# Patient Record
Sex: Female | Born: 1960 | Race: White | Hispanic: No | Smoking: Current every day smoker
Health system: Southern US, Community
[De-identification: ages and names within clinical notes are randomized; demographics above are authoritative.]

## PROBLEM LIST (undated history)

## (undated) VITALS — BP 108/59 | HR 86 | Temp 97.9°F | Resp 16 | Ht 61.5 in | Wt 111.0 lb

## (undated) DIAGNOSIS — F32A Depression, unspecified: Secondary | ICD-10-CM

## (undated) DIAGNOSIS — F329 Major depressive disorder, single episode, unspecified: Secondary | ICD-10-CM

## (undated) DIAGNOSIS — M1990 Unspecified osteoarthritis, unspecified site: Secondary | ICD-10-CM

## (undated) DIAGNOSIS — R569 Unspecified convulsions: Secondary | ICD-10-CM

## (undated) HISTORY — PX: OTHER SURGICAL HISTORY: SHX169

## (undated) HISTORY — DX: Unspecified osteoarthritis, unspecified site: M19.90

---

## 1999-09-04 ENCOUNTER — Other Ambulatory Visit: Admission: RE | Admit: 1999-09-04 | Discharge: 1999-09-04 | Payer: Self-pay | Admitting: Obstetrics and Gynecology

## 1999-10-14 ENCOUNTER — Ambulatory Visit (HOSPITAL_COMMUNITY): Admission: RE | Admit: 1999-10-14 | Discharge: 1999-10-14 | Payer: Self-pay | Admitting: Obstetrics and Gynecology

## 1999-10-14 ENCOUNTER — Encounter: Payer: Self-pay | Admitting: Obstetrics and Gynecology

## 2000-03-09 ENCOUNTER — Inpatient Hospital Stay (HOSPITAL_COMMUNITY): Admission: AD | Admit: 2000-03-09 | Discharge: 2000-03-11 | Payer: Self-pay | Admitting: Obstetrics and Gynecology

## 2000-03-09 ENCOUNTER — Encounter (INDEPENDENT_AMBULATORY_CARE_PROVIDER_SITE_OTHER): Payer: Self-pay

## 2000-03-11 ENCOUNTER — Encounter: Payer: Self-pay | Admitting: Obstetrics and Gynecology

## 2000-09-21 ENCOUNTER — Emergency Department (HOSPITAL_COMMUNITY): Admission: EM | Admit: 2000-09-21 | Discharge: 2000-09-21 | Payer: Self-pay

## 2001-11-30 ENCOUNTER — Emergency Department (HOSPITAL_COMMUNITY): Admission: EM | Admit: 2001-11-30 | Discharge: 2001-11-30 | Payer: Self-pay | Admitting: Emergency Medicine

## 2004-02-15 ENCOUNTER — Emergency Department (HOSPITAL_COMMUNITY): Admission: EM | Admit: 2004-02-15 | Discharge: 2004-02-15 | Payer: Self-pay | Admitting: Family Medicine

## 2004-02-23 ENCOUNTER — Emergency Department (HOSPITAL_COMMUNITY): Admission: EM | Admit: 2004-02-23 | Discharge: 2004-02-23 | Payer: Self-pay | Admitting: Family Medicine

## 2004-05-16 ENCOUNTER — Emergency Department (HOSPITAL_COMMUNITY): Admission: EM | Admit: 2004-05-16 | Discharge: 2004-05-16 | Payer: Self-pay | Admitting: Family Medicine

## 2004-11-01 ENCOUNTER — Emergency Department (HOSPITAL_COMMUNITY): Admission: EM | Admit: 2004-11-01 | Discharge: 2004-11-01 | Payer: Self-pay | Admitting: Emergency Medicine

## 2004-11-08 ENCOUNTER — Emergency Department (HOSPITAL_COMMUNITY): Admission: EM | Admit: 2004-11-08 | Discharge: 2004-11-08 | Payer: Self-pay | Admitting: Family Medicine

## 2005-01-11 ENCOUNTER — Emergency Department (HOSPITAL_COMMUNITY): Admission: EM | Admit: 2005-01-11 | Discharge: 2005-01-12 | Payer: Self-pay | Admitting: Emergency Medicine

## 2005-01-12 ENCOUNTER — Inpatient Hospital Stay (HOSPITAL_COMMUNITY): Admission: EM | Admit: 2005-01-12 | Discharge: 2005-01-15 | Payer: Self-pay | Admitting: Psychiatry

## 2005-01-12 ENCOUNTER — Ambulatory Visit: Payer: Self-pay | Admitting: Psychiatry

## 2005-03-03 ENCOUNTER — Emergency Department (HOSPITAL_COMMUNITY): Admission: EM | Admit: 2005-03-03 | Discharge: 2005-03-03 | Payer: Self-pay | Admitting: Family Medicine

## 2005-03-07 ENCOUNTER — Inpatient Hospital Stay (HOSPITAL_COMMUNITY): Admission: EM | Admit: 2005-03-07 | Discharge: 2005-03-09 | Payer: Self-pay | Admitting: Emergency Medicine

## 2005-03-09 ENCOUNTER — Inpatient Hospital Stay (HOSPITAL_COMMUNITY): Admission: AD | Admit: 2005-03-09 | Discharge: 2005-03-11 | Payer: Self-pay | Admitting: Psychiatry

## 2005-03-09 ENCOUNTER — Ambulatory Visit: Payer: Self-pay | Admitting: Psychiatry

## 2005-03-16 ENCOUNTER — Other Ambulatory Visit (HOSPITAL_COMMUNITY): Admission: RE | Admit: 2005-03-16 | Discharge: 2005-06-14 | Payer: Self-pay | Admitting: Psychiatry

## 2005-10-10 ENCOUNTER — Emergency Department (HOSPITAL_COMMUNITY): Admission: EM | Admit: 2005-10-10 | Discharge: 2005-10-11 | Payer: Self-pay | Admitting: Emergency Medicine

## 2006-11-02 ENCOUNTER — Emergency Department (HOSPITAL_COMMUNITY): Admission: EM | Admit: 2006-11-02 | Discharge: 2006-11-02 | Payer: Self-pay | Admitting: Emergency Medicine

## 2007-01-16 ENCOUNTER — Emergency Department (HOSPITAL_COMMUNITY): Admission: EM | Admit: 2007-01-16 | Discharge: 2007-01-16 | Payer: Self-pay | Admitting: Emergency Medicine

## 2007-12-30 ENCOUNTER — Emergency Department (HOSPITAL_COMMUNITY): Admission: EM | Admit: 2007-12-30 | Discharge: 2007-12-30 | Payer: Self-pay | Admitting: Emergency Medicine

## 2008-09-16 ENCOUNTER — Emergency Department (HOSPITAL_COMMUNITY): Admission: EM | Admit: 2008-09-16 | Discharge: 2008-09-16 | Payer: Self-pay | Admitting: Family Medicine

## 2008-12-01 ENCOUNTER — Emergency Department (HOSPITAL_COMMUNITY): Admission: EM | Admit: 2008-12-01 | Discharge: 2008-12-02 | Payer: Self-pay | Admitting: Emergency Medicine

## 2010-05-15 LAB — CBC
HCT: 36.2 % (ref 36.0–46.0)
Hemoglobin: 12.6 g/dL (ref 12.0–15.0)
MCHC: 34.8 g/dL (ref 30.0–36.0)
MCV: 92.7 fL (ref 78.0–100.0)
Platelets: 270 10*3/uL (ref 150–400)
RDW: 13.3 % (ref 11.5–15.5)

## 2010-05-15 LAB — ETHANOL: Alcohol, Ethyl (B): 203 mg/dL — ABNORMAL HIGH (ref 0–10)

## 2010-05-15 LAB — APTT: aPTT: 26 seconds (ref 24–37)

## 2010-05-15 LAB — LACTIC ACID, PLASMA: Lactic Acid, Venous: 2.2 mmol/L (ref 0.5–2.2)

## 2010-05-15 LAB — COMPREHENSIVE METABOLIC PANEL
ALT: 13 U/L (ref 0–35)
Calcium: 8.3 mg/dL — ABNORMAL LOW (ref 8.4–10.5)
GFR calc Af Amer: >60 mL/min (ref >60)
Glucose, Bld: 97 mg/dL (ref 70–99)
Total Bilirubin: 0.4 mg/dL (ref 0.3–1.2)
Total Protein: 6.8 g/dL (ref 6.0–8.3)

## 2010-05-15 LAB — PROTIME-INR: INR: 0.99 (ref 0.00–1.49)

## 2010-05-15 LAB — URINALYSIS, ROUTINE W REFLEX MICROSCOPIC
Bilirubin Urine: NEGATIVE
Glucose, UA: NEGATIVE mg/dL
Ketones, ur: NEGATIVE mg/dL
Protein, ur: NEGATIVE mg/dL
Specific Gravity, Urine: 1.042 — ABNORMAL HIGH (ref 1.005–1.030)
pH: 5 (ref 5.0–8.0)

## 2010-05-15 LAB — POCT I-STAT, CHEM 8
Calcium, Ion: 1.08 mmol/L — ABNORMAL LOW (ref 1.12–1.32)
HCT: 40 % (ref 36.0–46.0)
Hemoglobin: 13.6 g/dL (ref 12.0–15.0)
TCO2: 20 mmol/L (ref 0–100)

## 2010-05-15 LAB — POCT PREGNANCY, URINE: Preg Test, Ur: NEGATIVE

## 2010-05-15 LAB — URINE MICROSCOPIC-ADD ON

## 2010-05-15 LAB — RAPID URINE DRUG SCREEN, HOSP PERFORMED: Barbiturates: NOT DETECTED

## 2010-05-15 LAB — TYPE AND SCREEN

## 2010-05-15 LAB — ABO/RH: ABO/RH(D): B POS

## 2010-05-21 ENCOUNTER — Ambulatory Visit (HOSPITAL_COMMUNITY): Payer: Self-pay | Admitting: Psychiatry

## 2010-05-21 DIAGNOSIS — F1910 Other psychoactive substance abuse, uncomplicated: Secondary | ICD-10-CM

## 2010-05-21 DIAGNOSIS — F3189 Other bipolar disorder: Secondary | ICD-10-CM
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
## 2010-05-28 ENCOUNTER — Encounter (HOSPITAL_COMMUNITY): Payer: Self-pay | Admitting: Psychiatry

## 2010-05-28 DIAGNOSIS — F3189 Other bipolar disorder: Secondary | ICD-10-CM

## 2010-06-09 ENCOUNTER — Ambulatory Visit (HOSPITAL_COMMUNITY): Payer: Self-pay | Admitting: Marriage and Family Therapist

## 2010-06-20 ENCOUNTER — Encounter (HOSPITAL_COMMUNITY): Payer: Self-pay | Admitting: Psychiatry

## 2010-06-20 DIAGNOSIS — F3189 Other bipolar disorder: Secondary | ICD-10-CM
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
## 2010-06-27 NOTE — Discharge Summary (Signed)
[facility]  Patient:    [name]                       MRN: [id number] Adm. Date:  [id number] Disc. Date: [id number] Attending:  Oliver Pila                           Discharge Summary  ADMISSION DIAGNOSES:          1. Intrauterine pregnancy at 39 weeks.                               2. Advanced maternal age.                               3. Prior cesarean section.                               4. Group B streptococcus carrier.                               5. Desires sterility.  DISCHARGE DIAGNOSES:          1. Intrauterine pregnancy at 39 weeks.                               2. Advanced maternal age.                               3. Prior cesarean section.                               4. Group B streptococcus carrier.                               5. Desires sterility.                               6. shoulder dystocia.  PROCEDURES:                   Vaginal birth after cesarean section.                               Postpartum tubal ligation.  COMPLICATIONS:                None.  CONSULTATIONS:                None.  HISTORY AND PHYSICAL:         This is a 50 year old white female, gravida 4, para 3-0-0-2, with an EGA of 39+ weeks with and EDC of January 30 by a first trimester ultrasound who presents for induction due to a favorable cervix at term with ongoing back pain. Pregnancy complicated by advanced maternal age for which she declined an amniocentesis but had a normal ultrasound. Previous cesarean section with successful VBAC after that. She is also group B strep positive and desires tubal  sterilization.  PRENATAL LABORATORY DATA:     Blood type is B positive with a negative antibody screen. RPR nonreactive. Rubella immune. Hepatitis B surface antigen negative. HIV negative. Gonorrhea and Chlamydia negative. Group B strep is positive.  PAST OBSTETRIC HISTORY:       1981 vaginal delivery of 5 pound 5 ounces.  1990 cesarean section for breech. 1993 VBAC 8 pounds 3 ounces.  PAST MEDICAL HISTORY:         Depression.  SURGICAL HISTORY:             Cesarean section.  PHYSICAL EXAMINATION:  GENERAL:                      Afebrile with stable vital signs. Fetal heart tracing was reactive.  ABDOMEN:                      Gravid, nontender with an estimate fetal weight of 7-1/2 to 8 pounds.  PELVIC:                       Cervix was 2, 50, -2, and posterior.  HOSPITAL COURSE:              The patient was admitted and started on Pitocin for induction. She was also started on penicillin for group B strep prophylaxis. On Dr. Loleta Books first exam she attempted to perform artificial rupture of membranes and got a little bit of fluid. On the Pitocin, the patient progressed to 4, 75, and -2 and had evidence of ruptured membranes. She then received an epidural. She progressed throughout the day, progressed to complete fairly quickly and pushed well. She had a VBAC of a vigorous female infant with Apgars of 8 and 9 that weighted 7 pounds 8 ounces. There was a nuchal cord x 1 which was reduced. There was a moderate shoulder dystocia which was resolved with McRoberts maneuver, suprapubic pressure, Woodscrew maneuver, and posterior arm release.  The placenta delivered manually and her uterus was inspected without remaining fragments noted. Her old uterine scar was intact. Cervix, rectum, and perineum were intact. She had a small vaginal cyst on the posterior wall which was drained with a needle of milky fluid. Estimated blood loss was 400 cc. The patient desired postpartum tubal sterilization, and on postpartum day #1, she underwent a tubal ligation by Dr. Senaida Ores under epidural anesthesia without complications.  Postoperatively, she did very well, remained afebrile, and was rapidly able to ambulated and tolerate a regular diet. However, on the morning of postpartum day #2, she complained of sever right  lower quadrant pain since having the tubal ligation. The Percocet helped but she still had significant pain.  LABORATORIES:                 Admission hemoglobin of 10.7. Postpartum hemoglobin 9.9 and a repeat hemoglobin on postpartum day #2 of 10.5. An abdominal ultrasound performed due to the pain, was essentially normal. Her pain improved throughout the day and on the evening of January 31 she was felt to be stable enough for discharge home.  CONDITION ON DISCHARGE:       Stable.  DISPOSITION:                  Discharge to home.  DISCHARGE INSTRUCTIONS:       Diet: Regular. Activity: pelvic rest.  FOLLOWUP:  In six weeks.  DISCHARGE MEDICATIONS:        Percocet p.r.n. pain and she is given our discharge pamphlet. DD:  03/11/00 TD:  03/11/00 Job: [id number]

## 2010-06-27 NOTE — Discharge Summary (Signed)
NAME:  [name], [name] NO.:  [id number]   MEDICAL RECORD NO.:  [id number]          PATIENT TYPE:  IPS   LOCATION:  0301                          FACILITY:  [facility]   PHYSICIAN:  Jeanice Lim, M.D. DATE OF BIRTH:  [birth year]   DATE OF ADMISSION:  01/12/2005  DATE OF DISCHARGE:  01/15/2005                                 DISCHARGE SUMMARY   IDENTIFYING DATA:  This is a 50 year old Caucasian female, single,  voluntarily admitted, with history of anxiety, tremor, trying to quit  alcohol, intoxicated, with an alcohol level of 137 in the ER, agitated,  pulling hair, cursing.  Given Geodon IM for threatening behavior, reporting  suicidal thoughts with plan to overdose on pills alone while in a hotel  room, drinking alcohol, escalating over the last 6 weeks, a bottle of wine  daily for 2 weeks, endorsed history of mood problems since teenager.  First  [facility] admission with a history of multiple  hospitalizations x9 years, had been at [facility], [facility],  and  [facility] for depression in the past.  In the past, had been on Lamictal,  Depakote, Paxil.   ADMISSION MEDICATIONS:  Cymbalta, Ativan, Xanax off the street, and  Seroquel.   ALLERGIES:  No known drug allergies.   PHYSICAL AND NEUROLOGICAL EXAMINATION:  Within normal limits.   ROUTINE ADMISSION LABS:  Within normal limits.   MENTAL STATUS EXAM:  Fully alert, cooperative, pleasant, dazed look,  decreased speech.  Mood depressed, affect restricted, feeling guilty about  alcohol use.  Thought processes goal directed, no agitation or paranoia,  positive fleeting suicidal thoughts, cognitively intact, judgment and  insight poor.   ADMISSION DIAGNOSES:  AXIS I:  Bipolar disorder, not otherwise specified;  alcohol dependence; benzodiazepines dependence; possible substance-induced  mood disorder superimposed on bipolar disorder.  AXIS II:  Deferred.  AXIS III:  None.  AXIS IV:  Severe,  protracted grief secondary to death of daughter, problems  with limited support system.  AXIS V:  30/65.   HOSPITAL COURSE:  The patient was admitted and ordered routine p.r.n.  medications, underwent further monitoring, and was encouraged to participate  in individual, group and milieu therapy.  Labs were monitored and mild  hypokalemia treated.  The patient was monitored for safety, participated in  therapy, developed a relapse prevention plan as she was stabilized on  medications, reported resolution of withdrawal symptoms and stabilization of  mood, and was discharged in improved condition.  Family meeting with husband  went well and he was determined to be supportive and understanding and  agreeable with the aftercare plan.  The patient was discharged in improved  condition.  Mood was euthymic, affect brighter, thought processes goal  directed, no dangerous ideation, no withdrawal symptoms, reporting  motivation to be compliant with remaining abstinent and with aftercare  appointments.  The patient was given medication education again and  discharged on:  1.  Cymbalta 30 mg q.a.m. and 30 mg at 6 p.m.  2.  Seroquel 300 mg at 8 p.m.  3.  Elavil 10 mg 3 at  8 p.m.   DISPOSITION:  The patient was to follow up with Dr. Bayard Males on Tuesday,  December 12, at 2 p.m. and Bartholomew Crews on Thursday, December 14, at 5 p.m.   DISCHARGE DIAGNOSES:  AXIS I:  Bipolar disorder, not otherwise specified;  alcohol dependence; benzodiazepines dependence; possible substance-induced  mood disorder superimposed on bipolar disorder.  AXIS II:  Deferred.  AXIS III:  None.  AXIS IV:  Severe, protracted grief secondary to death of daughter, problems  with limited support system.  AXIS V:  Global assessment of functioning on discharge was 55.      Jeanice Lim, M.D.  Electronically Signed     JEM/MEDQ  D:  02/03/2005  T:  02/03/2005  Job:  [id number]

## 2010-06-27 NOTE — Op Note (Signed)
[facility]  Patient:    [name]                       MRN: [id number] Proc. Date: 03/10/00 Adm. Date:  [id number] Attending:  Oliver Pila                           Operative Report  PREOPERATIVE DIAGNOSES: 1. Status post normal spontaneous vaginal delivery. 2. Desires sterility.  POSTOPERATIVE DIAGNOSES: 1. Status post normal spontaneous vaginal delivery. 2. Desires sterility.  PROCEDURE:  Tubal ligation.  SURGEON:  Dr. Huel Cote.  ANESTHESIA:  Epidural.  FINDINGS:  Normal uterus and tubes and ovaries were noted bilaterally.  DESCRIPTION OF PROCEDURE:  The patient was taken to the operating room where epidural anesthesia was found to be adequate by ______ test. She was then prepped and draped in normal sterile fashion in the dorsal supine position. A small infraumbilical incision was made with the scalpel approximately 2 cm and carried through to the underlying layer of fascia by sharp dissection. The fascia was opened sharply and the peritoneal cavity inspected and found to be within normal limits. An [employer] retractor was then placed within the incision and the patients right fallopian tube was identified, grasped with Babcock clamps and elevated through the incision. This was then raised in a 2-3 cm buckle of tube which was tied off with #0 Vicryl in two free ties. The tied off segment was then amputated and handed off to pathology and the free ends cauterized with Bovie cautery. All were found to be hemostatic, therefore, the pedicle was returned to the abdomen. Attention was then turned to the patients left fallopian tube which in a similar fashion was grasped with Babcock clamps, traced out to the fimbriated end and a 2 cm segment removed after it had been tied off with zero plain ties. Each free end was cauterized and found to be hemostatic. This was also then returned to the abdomen. The fascia was then closed  with #0 Vicryl in a running fashion and skin was closed with 3-0 Vicryl in a subcuticular stitch. Sponge, lap, and needle counts were correct x 2 and the patient was taken to the recovery room in stable condition. DD:  03/10/00 TD:  03/10/00 Job: [id number]
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
## 2010-07-15 ENCOUNTER — Ambulatory Visit (HOSPITAL_COMMUNITY): Payer: Self-pay | Admitting: Marriage and Family Therapist

## 2010-07-18 ENCOUNTER — Encounter (HOSPITAL_COMMUNITY): Payer: Self-pay | Admitting: Psychiatry

## 2010-07-28 ENCOUNTER — Encounter (HOSPITAL_COMMUNITY): Payer: Self-pay | Admitting: Psychiatry

## 2010-07-28 DIAGNOSIS — F3189 Other bipolar disorder: Secondary | ICD-10-CM

## 2010-08-11 ENCOUNTER — Encounter (HOSPITAL_BASED_OUTPATIENT_CLINIC_OR_DEPARTMENT_OTHER): Payer: Self-pay | Admitting: Marriage and Family Therapist

## 2010-08-11 DIAGNOSIS — F3160 Bipolar disorder, current episode mixed, unspecified: Secondary | ICD-10-CM

## 2010-08-20 ENCOUNTER — Encounter (HOSPITAL_BASED_OUTPATIENT_CLINIC_OR_DEPARTMENT_OTHER): Payer: Self-pay | Admitting: Marriage and Family Therapist

## 2010-08-20 DIAGNOSIS — F3160 Bipolar disorder, current episode mixed, unspecified: Secondary | ICD-10-CM

## 2010-08-20 DIAGNOSIS — F1020 Alcohol dependence, uncomplicated: Secondary | ICD-10-CM

## 2010-08-28 ENCOUNTER — Encounter (HOSPITAL_BASED_OUTPATIENT_CLINIC_OR_DEPARTMENT_OTHER): Payer: Self-pay | Admitting: Marriage and Family Therapist

## 2010-08-28 DIAGNOSIS — F1020 Alcohol dependence, uncomplicated: Secondary | ICD-10-CM

## 2010-08-28 DIAGNOSIS — F3160 Bipolar disorder, current episode mixed, unspecified: Secondary | ICD-10-CM

## 2010-09-08 ENCOUNTER — Encounter (HOSPITAL_COMMUNITY): Payer: Self-pay | Admitting: Marriage and Family Therapist

## 2010-09-08 ENCOUNTER — Encounter (HOSPITAL_COMMUNITY): Payer: Self-pay | Admitting: Psychiatry

## 2010-09-08 DIAGNOSIS — F3189 Other bipolar disorder: Secondary | ICD-10-CM
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
## 2010-09-18 ENCOUNTER — Encounter (HOSPITAL_BASED_OUTPATIENT_CLINIC_OR_DEPARTMENT_OTHER): Payer: Self-pay | Admitting: Marriage and Family Therapist

## 2010-09-18 DIAGNOSIS — F39 Unspecified mood [affective] disorder: Secondary | ICD-10-CM

## 2010-09-18 DIAGNOSIS — F1020 Alcohol dependence, uncomplicated: Secondary | ICD-10-CM

## 2010-09-25 ENCOUNTER — Encounter (HOSPITAL_BASED_OUTPATIENT_CLINIC_OR_DEPARTMENT_OTHER): Payer: Self-pay | Admitting: Marriage and Family Therapist

## 2010-09-25 DIAGNOSIS — F1020 Alcohol dependence, uncomplicated: Secondary | ICD-10-CM

## 2010-09-25 DIAGNOSIS — F3160 Bipolar disorder, current episode mixed, unspecified: Secondary | ICD-10-CM

## 2010-10-02 ENCOUNTER — Encounter (HOSPITAL_COMMUNITY): Payer: Self-pay | Admitting: Marriage and Family Therapist

## 2010-10-08 ENCOUNTER — Encounter (HOSPITAL_COMMUNITY): Payer: Self-pay | Admitting: Psychiatry

## 2010-10-08 ENCOUNTER — Encounter (INDEPENDENT_AMBULATORY_CARE_PROVIDER_SITE_OTHER): Payer: Self-pay | Admitting: Marriage and Family Therapist

## 2010-10-08 DIAGNOSIS — F1020 Alcohol dependence, uncomplicated: Secondary | ICD-10-CM

## 2010-10-08 DIAGNOSIS — F3160 Bipolar disorder, current episode mixed, unspecified: Secondary | ICD-10-CM

## 2010-10-16 ENCOUNTER — Inpatient Hospital Stay (INDEPENDENT_AMBULATORY_CARE_PROVIDER_SITE_OTHER)
Admission: RE | Admit: 2010-10-16 | Discharge: 2010-10-16 | Disposition: A | Discharge status: Home / Self Care | Payer: Self-pay | Source: Ambulatory Visit | Attending: Family Medicine | Admitting: Family Medicine

## 2010-10-16 ENCOUNTER — Encounter (INDEPENDENT_AMBULATORY_CARE_PROVIDER_SITE_OTHER): Payer: Self-pay | Admitting: Marriage and Family Therapist

## 2010-10-16 ENCOUNTER — Ambulatory Visit (INDEPENDENT_AMBULATORY_CARE_PROVIDER_SITE_OTHER): Payer: Self-pay

## 2010-10-16 DIAGNOSIS — F3160 Bipolar disorder, current episode mixed, unspecified: Secondary | ICD-10-CM

## 2010-10-16 DIAGNOSIS — F1020 Alcohol dependence, uncomplicated: Secondary | ICD-10-CM

## 2010-10-16 DIAGNOSIS — S92309A Fracture of unspecified metatarsal bone(s), unspecified foot, initial encounter for closed fracture: Secondary | ICD-10-CM

## 2010-10-22 ENCOUNTER — Encounter (HOSPITAL_COMMUNITY): Payer: Self-pay | Admitting: Marriage and Family Therapist

## 2010-10-30 ENCOUNTER — Encounter (INDEPENDENT_AMBULATORY_CARE_PROVIDER_SITE_OTHER): Payer: Self-pay | Admitting: Marriage and Family Therapist

## 2010-10-30 DIAGNOSIS — F39 Unspecified mood [affective] disorder: Secondary | ICD-10-CM

## 2010-11-03 ENCOUNTER — Encounter (INDEPENDENT_AMBULATORY_CARE_PROVIDER_SITE_OTHER): Payer: Self-pay | Admitting: Psychiatry

## 2010-11-03 ENCOUNTER — Encounter (HOSPITAL_COMMUNITY): Payer: Self-pay | Admitting: Marriage and Family Therapist

## 2010-11-03 DIAGNOSIS — F3189 Other bipolar disorder: Secondary | ICD-10-CM

## 2010-11-11 LAB — POCT CARDIAC MARKERS: Troponin i, poc: <0.05

## 2010-11-11 LAB — DIFFERENTIAL
Basophils Absolute: 0
Eosinophils Relative: 1
Lymphocytes Relative: 20
Neutro Abs: 8.5 — ABNORMAL HIGH
Neutrophils Relative %: 76

## 2010-11-11 LAB — POCT I-STAT, CHEM 8
BUN: 8
Calcium, Ion: 1.15
TCO2: 25

## 2010-11-11 LAB — CBC
HCT: 38.2
Platelets: 283
RDW: 13.1

## 2010-11-13 ENCOUNTER — Encounter (INDEPENDENT_AMBULATORY_CARE_PROVIDER_SITE_OTHER): Payer: Self-pay | Admitting: Marriage and Family Therapist

## 2010-11-13 DIAGNOSIS — F3160 Bipolar disorder, current episode mixed, unspecified: Secondary | ICD-10-CM

## 2010-11-13 DIAGNOSIS — F1020 Alcohol dependence, uncomplicated: Secondary | ICD-10-CM

## 2010-11-19 ENCOUNTER — Encounter (HOSPITAL_COMMUNITY): Payer: Self-pay | Admitting: Marriage and Family Therapist

## 2010-11-20 LAB — BASIC METABOLIC PANEL
CO2: 25
Chloride: 102
GFR calc Af Amer: >60
Potassium: 3.9
Sodium: 134 — ABNORMAL LOW

## 2010-11-20 LAB — URINALYSIS, ROUTINE W REFLEX MICROSCOPIC
Bilirubin Urine: NEGATIVE
Glucose, UA: NEGATIVE
Ketones, ur: NEGATIVE
Nitrite: NEGATIVE
pH: 6

## 2010-11-20 LAB — DIFFERENTIAL
Basophils Relative: 0
Eosinophils Absolute: 0.2
Monocytes Absolute: 0.4
Monocytes Relative: 4

## 2010-11-20 LAB — CBC
HCT: 41.6
Hemoglobin: 14.4
MCHC: 34.5
MCV: 86.6
RBC: 4.81

## 2010-11-20 LAB — PREGNANCY, URINE: Preg Test, Ur: NEGATIVE

## 2010-11-27 ENCOUNTER — Encounter (INDEPENDENT_AMBULATORY_CARE_PROVIDER_SITE_OTHER): Payer: Self-pay | Admitting: Marriage and Family Therapist

## 2010-11-27 DIAGNOSIS — F39 Unspecified mood [affective] disorder: Secondary | ICD-10-CM

## 2010-11-27 DIAGNOSIS — F1020 Alcohol dependence, uncomplicated: Secondary | ICD-10-CM

## 2010-12-03 ENCOUNTER — Encounter (INDEPENDENT_AMBULATORY_CARE_PROVIDER_SITE_OTHER): Payer: Self-pay | Admitting: Psychiatry

## 2010-12-03 DIAGNOSIS — F3189 Other bipolar disorder: Secondary | ICD-10-CM

## 2010-12-04 ENCOUNTER — Encounter (HOSPITAL_COMMUNITY): Payer: Self-pay | Admitting: Marriage and Family Therapist

## 2010-12-11 ENCOUNTER — Encounter (HOSPITAL_COMMUNITY): Payer: Self-pay | Admitting: Marriage and Family Therapist

## 2011-01-09 ENCOUNTER — Ambulatory Visit (HOSPITAL_COMMUNITY): Payer: Self-pay | Admitting: Psychiatry

## 2011-01-09 ENCOUNTER — Encounter (HOSPITAL_COMMUNITY): Payer: Self-pay | Admitting: Psychiatry

## 2011-01-09 DIAGNOSIS — F319 Bipolar disorder, unspecified: Secondary | ICD-10-CM

## 2011-01-09 MED ORDER — DULOXETINE HCL 30 MG PO CPEP: 30.0000 mg | ORAL_CAPSULE | Freq: Every day | ORAL | Status: DC

## 2011-01-09 MED ORDER — QUETIAPINE FUMARATE 200 MG PO TABS: 200.0000 mg | ORAL_TABLET | Freq: Every day | ORAL | Status: DC

## 2011-01-09 NOTE — Progress Notes (Signed)
Patient came for her followup appointment. She endorsed this month is difficult has her brother still in the hospital and she has been not working to find as she's been taking care of her brother. She is been compliant with her medications seroquel 200 mg and Cymbalta 30 mg. She is still waiting for Seroquel from [facility]. The she has been under stress but she denies any agitation anger or mood swings. She is sleeping better with her medication. She has not started into custody better and she wants to give more time to his brother who has been very sick. She reported no side effects of medication. She has been seeing therapist regularly. She is not using any drugs  Mental status examination Patient is casually dressed. She is fairly groomed. Her speech is fast and rapid but clear and coherent. She denies any auditory or visual hallucination. She described her mood is anxious and her affect is constricted. She denies any active or passive suicidal thinking homicidal thinking. There are no psychotic symptoms present. She's alert and oriented x3. Her insight judgment and pulse control is okay.  Assessment Bipolar disorder Polysubstance dependence in complete remission  Plan I will continue her current medication in his prescription of Cymbalta 30 mg and seroquel200 mg with refills given. I have explained risks and benefits of medication. She will continue to see therapist regularly I will see her again in 2 months

## 2011-01-12 ENCOUNTER — Ambulatory Visit (INDEPENDENT_AMBULATORY_CARE_PROVIDER_SITE_OTHER): Payer: Self-pay | Admitting: Marriage and Family Therapist

## 2011-01-12 DIAGNOSIS — F1020 Alcohol dependence, uncomplicated: Secondary | ICD-10-CM

## 2011-01-12 DIAGNOSIS — F3162 Bipolar disorder, current episode mixed, moderate: Secondary | ICD-10-CM

## 2011-01-12 NOTE — Progress Notes (Signed)
   THERAPIST PROGRESS NOTE  Session Time: 8:00 - 9:00 a.m.  Participation Level: Active  Behavioral Response: Well GroomedAlertAnxious  Type of Therapy: Individual Therapy  Treatment Goals addressed: Coping and Diagnosis: bipolar d/o & alcoholism  Interventions: Strength-based, Assertiveness Training and Family Systems  Summary: [name] is a 50 y.o. female who presents with bipolar disorder and alcoholism in remission.  Patient reports she quit her job because it was emotionally unhealthy for her.  States she feels better since quitting the job.  She also reports she is investigating getting supervision in order to see her 47 y/o daughter that she has not seen in almost a year.  Patient also talked about giving up her apartment to her niece and is now living with a female that she knew in high school.  Patient did state before she moved in with him she asked "people I trust" their opinion about whether or not to move in with the female and states they supported this decision.  Patient states this is the "healthiest relationship I've ever had with a female."  She reports continuing to stay sober and basically is spending what she called "quality time" with the family.   Suicidal/Homicidal: Nowithout intent/plan  Therapist Response: Used strength-based therapy to support patient in her decision to quit her job.  Also talked about patient asserting herself about the BF whether or not others around her did not approve (patient reported she was worried this writer would not approve).  Plan: Return again in 2 weeks.  Diagnosis: Axis I: Bipolar, mixed and Alcoholism, in remission    Axis II: Deferred    Crecencio Kwiatek, LMFT 01/12/2011

## 2011-01-26 ENCOUNTER — Encounter (HOSPITAL_COMMUNITY): Payer: Self-pay | Admitting: Marriage and Family Therapist

## 2011-02-09 ENCOUNTER — Ambulatory Visit (HOSPITAL_COMMUNITY): Payer: Self-pay | Admitting: Marriage and Family Therapist

## 2011-02-13 ENCOUNTER — Other Ambulatory Visit: Payer: Self-pay

## 2011-02-13 ENCOUNTER — Encounter: Payer: Self-pay | Admitting: Emergency Medicine

## 2011-02-13 ENCOUNTER — Emergency Department (HOSPITAL_COMMUNITY)
Admission: EM | Admit: 2011-02-13 | Discharge: 2011-02-13 | Disposition: A | Discharge status: Home / Self Care | Payer: Self-pay | Attending: Emergency Medicine | Admitting: Emergency Medicine

## 2011-02-13 ENCOUNTER — Encounter (HOSPITAL_COMMUNITY): Payer: Self-pay | Admitting: Psychiatry

## 2011-02-13 DIAGNOSIS — R5381 Other malaise: Secondary | ICD-10-CM | POA: Insufficient documentation

## 2011-02-13 DIAGNOSIS — F1310 Sedative, hypnotic or anxiolytic abuse, uncomplicated: Secondary | ICD-10-CM | POA: Insufficient documentation

## 2011-02-13 DIAGNOSIS — F1910 Other psychoactive substance abuse, uncomplicated: Secondary | ICD-10-CM

## 2011-02-13 DIAGNOSIS — R5383 Other fatigue: Secondary | ICD-10-CM | POA: Insufficient documentation

## 2011-02-13 DIAGNOSIS — F3289 Other specified depressive episodes: Secondary | ICD-10-CM | POA: Insufficient documentation

## 2011-02-13 DIAGNOSIS — T424X4A Poisoning by benzodiazepines, undetermined, initial encounter: Secondary | ICD-10-CM | POA: Insufficient documentation

## 2011-02-13 DIAGNOSIS — T43502A Poisoning by unspecified antipsychotics and neuroleptics, intentional self-harm, initial encounter: Secondary | ICD-10-CM | POA: Insufficient documentation

## 2011-02-13 DIAGNOSIS — F329 Major depressive disorder, single episode, unspecified: Secondary | ICD-10-CM | POA: Insufficient documentation

## 2011-02-13 HISTORY — DX: Major depressive disorder, single episode, unspecified: F32.9

## 2011-02-13 HISTORY — DX: Depression, unspecified: F32.A

## 2011-02-13 LAB — RAPID URINE DRUG SCREEN, HOSP PERFORMED
Amphetamines: NOT DETECTED
Benzodiazepines: POSITIVE — AB
Tetrahydrocannabinol: POSITIVE — AB

## 2011-02-13 LAB — DIFFERENTIAL
Basophils Absolute: 0.1 10*3/uL (ref 0.0–0.1)
Eosinophils Absolute: 0.6 10*3/uL (ref 0.0–0.7)
Eosinophils Relative: 5 % (ref 0–5)
Neutrophils Relative %: 63 % (ref 43–77)

## 2011-02-13 LAB — CBC
MCH: 30.5 pg (ref 26.0–34.0)
MCV: 89 fL (ref 78.0–100.0)
Platelets: 358 10*3/uL (ref 150–400)
RBC: 4.1 MIL/uL (ref 3.87–5.11)
RDW: 13.5 % (ref 11.5–15.5)
WBC: 11.2 10*3/uL — ABNORMAL HIGH (ref 4.0–10.5)

## 2011-02-13 LAB — COMPREHENSIVE METABOLIC PANEL
ALT: 8 U/L (ref 0–35)
AST: 14 U/L (ref 0–37)
Albumin: 3.4 g/dL — ABNORMAL LOW (ref 3.5–5.2)
Alkaline Phosphatase: 63 U/L (ref 39–117)
Calcium: 8.3 mg/dL — ABNORMAL LOW (ref 8.4–10.5)
Potassium: 3.4 mEq/L — ABNORMAL LOW (ref 3.5–5.1)
Sodium: 140 mEq/L (ref 135–145)
Total Protein: 7 g/dL (ref 6.0–8.3)

## 2011-02-13 LAB — ETHANOL: Alcohol, Ethyl (B): 146 mg/dL — ABNORMAL HIGH (ref 0–11)

## 2011-02-13 MED ORDER — SODIUM CHLORIDE 0.9 % IV SOLN
Freq: Once | INTRAVENOUS | Status: AC
  Administered 2011-02-13: 03:00:00 via INTRAVENOUS

## 2011-02-13 NOTE — ED Provider Notes (Signed)
History     CSN: [id number]  Arrival date & time 02/13/11  0220   First MD Initiated Contact with Patient 02/13/11 [phone]      Chief Complaint  Patient presents with  . Drug Overdose    xanax    (Consider location/radiation/quality/duration/timing/severity/associated sxs/prior treatment) Patient is a 51 y.o. female presenting with Overdose. The history is provided by a friend and a relative. The history is limited by the condition of the patient.  Drug Overdose   patient here after drinking alcohol and taking Xanax. According to the boyfriend, patient was drinking alcohol tonight and became very emotional and took half of his bottle of Xanax. She denied any suicidal ideations to him but did admit some to EMS possibly. No recent history of increased depression according to the boyfriend. She hasn't taken her medications appropriately. Nothing makes her symptoms better or worse  Past Medical History  Diagnosis Date  . Depression     History reviewed. No pertinent past surgical history.  History reviewed. No pertinent family history.  History  Substance Use Topics  . Smoking status: Not on file  . Smokeless tobacco: Not on file  . Alcohol Use:     OB History    Grav Para Term Preterm Abortions TAB SAB Ect Mult Living                  Review of Systems  Unable to perform ROS   Allergies  Review of patient's allergies indicates no known allergies.  Home Medications   Current Outpatient Rx  Name Route Sig Dispense Refill  . DULOXETINE HCL 20 MG PO CPEP Oral Take 30 mg by mouth daily.     . QUETIAPINE FUMARATE 100 MG PO TABS Oral Take 300 mg by mouth at bedtime.       BP 97/81  Pulse 106  Resp 18  SpO2 100%  Physical Exam  Nursing note and vitals reviewed. Constitutional: She appears well-developed and well-nourished. She appears lethargic.  Non-toxic appearance. No distress.  HENT:  Head: Normocephalic and atraumatic.  Eyes: Conjunctivae, EOM and lids are  normal. Pupils are equal, round, and reactive to light.  Neck: Normal range of motion. Neck supple. No tracheal deviation present. No mass present.  Cardiovascular: Normal rate, regular rhythm and normal heart sounds.  Exam reveals no gallop.   No murmur heard. Pulmonary/Chest: Effort normal and breath sounds normal. No stridor. No respiratory distress. She has no decreased breath sounds. She has no wheezes. She has no rhonchi. She has no rales.  Abdominal: Soft. Normal appearance and bowel sounds are normal. She exhibits no distension. There is no tenderness. There is no rebound and no CVA tenderness.  Musculoskeletal: Normal range of motion. She exhibits no edema and no tenderness.  Neurological: She has normal strength. She appears lethargic. No sensory deficit. GCS eye subscore is 4. GCS verbal subscore is 3. GCS motor subscore is 4.  Skin: Skin is warm and dry. No abrasion and no rash noted.  Psychiatric: Her affect is blunt. She is slowed.    ED Course  Procedures (including critical care time)   Labs Reviewed  ETHANOL  URINE RAPID DRUG SCREEN (HOSP PERFORMED)  CBC  DIFFERENTIAL  COMPREHENSIVE METABOLIC PANEL  SALICYLATE LEVEL  ACETAMINOPHEN LEVEL   No results found.   No diagnosis found.    MDM  7:28 AM Patient seen and rechecked multiple times. Patient able to protect her airway. Patient's O2 sats were stable. Patient arousable to stimuli.  Patient sent her to Dr.rancour and she will be reevaluated when sober        Toy Baker, MD 02/13/11 [phone]

## 2011-02-13 NOTE — ED Notes (Signed)
C/o of being [age],taking po wells

## 2011-02-13 NOTE — ED Notes (Signed)
EAV:[id number]> Expected date:<BR> Expected time:<BR> Means of arrival:<BR> Comments:<BR> Depressed-took xanax/heavy ETOH

## 2011-02-13 NOTE — ED Notes (Signed)
Per EMS: Pt comes from home. Pt states she took 5 xanax and drank 2 bottles of wine. Pt's husband states she took "half the bottle of xanax" along with the wine.  Pt states she was not suicidal and was not attempting suicide but she was upset. Pt is alert but acting confused. Pt has 18g in left AC.

## 2011-02-13 NOTE — ED Notes (Signed)
Cristino Martes [phone] (call when pt wakes up)

## 2011-02-13 NOTE — ED Notes (Signed)
Patient was seen and evaluated by behavioral health. She is awake alert, and oriented and tolerating by mouth. Her husband states that she did not actually take a half bottle of Xanax last night. This is not a suicide attempt she is no suicidal or homicidal thoughts. She is a plan with her therapist tomorrow. Patient and husband are in agreement with this plan  Glynn Octave, MD 02/13/11 1240
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
## 2011-02-17 ENCOUNTER — Ambulatory Visit (INDEPENDENT_AMBULATORY_CARE_PROVIDER_SITE_OTHER): Payer: Self-pay | Admitting: Marriage and Family Therapist

## 2011-02-17 DIAGNOSIS — F3132 Bipolar disorder, current episode depressed, moderate: Secondary | ICD-10-CM

## 2011-02-17 DIAGNOSIS — F1020 Alcohol dependence, uncomplicated: Secondary | ICD-10-CM

## 2011-02-17 DIAGNOSIS — F10231 Alcohol dependence with withdrawal delirium: Secondary | ICD-10-CM

## 2011-02-17 NOTE — Progress Notes (Signed)
   THERAPIST PROGRESS NOTE  Session Time:  2:00 - 3:00 p.m.  Participation Level: Active  Behavioral Response: Well GroomedAlertAnxious  Type of Therapy: Individual Therapy  Treatment Goals addressed: Coping  Interventions: CBT, Strength-based, Biofeedback, Assertiveness Training and Supportive  Summary: [name] is a 51 y.o. female who presents with bipolar disorder and alcoholism.  She was referred by Dr. Lolly Mustache.   Patient came to session crying.  She reports that since she has been sober for awhile she has been what she called "waking up" being less foggy in her thinking.  This has lead to her noticing things around her she did not pay attention to because she was drinking, things that are no longer acceptable (in how she allows herself to be treated).  She also said she has been having emotional flooding.  Discussed these symptoms as normal for someone in recovery from alcoholism; however patient disclosed that on 02/10/11 she relapsed on at last one bottle of wine and "took a bunch of vitamins."  She said she did not believe it was a suicide attempt or solely being intoxicated.  Patient states she continues to live with her high school boyfriend and there are problems e.g., partner's drinking.   Suicidal/Homicidal: Nowithout intent/plan  Therapist Response: Discussed first starting with obtaining an assessment for CD IOP here and patient agreed.  Patient will speak to her BF about getting to the program three days a week and also have him in for a therapy session to discuss patient's needs and help support her when she tells him she needs him to stop drinking.  Will call therapist in the next 48 hours to begin this process.  Also gave therapist permission to speak to the CD IOP therapist about her relapse.  Note:  Spoke to Charmian Muff, CD IOP therapist.  There is room in the group for patient to begin therap7 asap.   Plan: Return again in 2 weeks.  Diagnosis: Axis I:  Bipolar d/o,  mixed, Alcoholism    Axis II: Deferred    Alastor Kneale, LMFT 02/17/2011

## 2011-02-18 ENCOUNTER — Encounter (HOSPITAL_COMMUNITY): Payer: Self-pay | Admitting: Marriage and Family Therapist

## 2011-02-18 NOTE — Progress Notes (Signed)
Patient called this Clinical research associate requesting admission into our CD IOP program.  Upon discussion, patient reported that this morning she believed she had a seizure.  She already had called our nurse on staff, Jacquelyne Balint, RN, who had the symptoms reported.  Told patient this writer need to talk to Dr. Lolly Mustache and Ms. Knisley before any referral could be placed.  Spoke to Dr. Lolly Mustache who went onto [facility] and found out that on 02/13/11 patient was in [facility] Emergency with an overdose.  Documentation also showed that patient was positive for alcohol, marijuana, benzodiazepines, and opiates (note that in her individual session with this writer yesterday only reported use of alcohol on 1/4 and did not disclose that she had an overdose and was treated).  All three providers met and decided patient needed to go to the emergency room due to current symptoms and that any mental health treatment would be on hold until patient was cleared medically.  She was also told to follow all recommendations for additional treatment.  She will call this Clinical research associate when she is medically cleared to decide how to proceed.

## 2011-02-23 ENCOUNTER — Encounter (HOSPITAL_COMMUNITY): Payer: Self-pay | Admitting: Marriage and Family Therapist

## 2011-03-09 ENCOUNTER — Ambulatory Visit (INDEPENDENT_AMBULATORY_CARE_PROVIDER_SITE_OTHER): Payer: Self-pay | Admitting: Psychiatry

## 2011-03-09 DIAGNOSIS — F319 Bipolar disorder, unspecified: Secondary | ICD-10-CM

## 2011-03-09 DIAGNOSIS — F1920 Other psychoactive substance dependence, uncomplicated: Secondary | ICD-10-CM

## 2011-03-09 MED ORDER — DULOXETINE HCL 30 MG PO CPEP: 30.0000 mg | ORAL_CAPSULE | Freq: Every day | ORAL | Status: DC

## 2011-03-09 NOTE — Progress Notes (Signed)
Patient came for her followup ointment. Earlier she was in the ER 3 weeks ago due to alcohol intoxication and overdose on medication. Patient denied having any suicidal thoughts but admitted that she was under a lot of the stress and going through severe depression due to holidays. Patient also admitted she uses marijuana and her fianc Valium. Her blood alcohol level was 146. She told that she was depressed as she could not able to see her children and on Christmas. Now patient has contact alert fight for custody battle. Patient denies any active or passive suicidal thoughts or homicidal thoughts. She's been compliant with Seroquel and Cymbalta. Patient denies using any drugs or drinking alcohol for taking any benzodiazepine in past 3 weeks. She also seeing therapist since then and also scheduled to see next week. Patient admitted when she takes Seroquel she sleeps good and does not want to change the dose. Patient regrets about her relapse however continued to commit to remain sober from using illegal substances and alcohol. She denies any agitation anger or mood swings. Patient has a plan to get custody of her children and she is working on it. We also talked about CD IOP program if she feels that she is going to restart drinking or using drugs.  Past psychiatric history Patient has long history of psychiatric illness with multiple hospitalization. Most of her admission is due to alcohol and drug-related issues. Patient is diagnosed with bipolar disorder. She recently tried to overdose on her medication but she denies that it was a suicidal attempt. She was intoxicated at that time.  Family history Patient endorse most of her family member has history of drug and alcohol. Psychosocial history Patient is born and raised in [place]. Currently she lives with her boyfriend. Patient has 3 previous marriage which are failed. Patient has 2 year old daughter who lives with her father. Patient does not have  custody however recently she is trying to get custody. Patient has not able to indicate with her daughter a regular basis. 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 She has GED education  Alcohol and drug history Patient endorsed significant history of using cocaine marijuana and alcohol. She had repeated hospitalization. Please see history of presenting illness for recent use of alcohol and drugs.  Medical history Patient has history of arthritis however she does not take any medication.  Mental status examination Patient is middle-aged woman who is casually dressed and fairly groomed. She is very emotional tearful and maintained fair eye contact. She is easily crying when she is talking about her symptoms and her daughter. She denies any auditory or visual hallucination. She denies any active or passive suicidal thoughts or homicidal thoughts. There no psychotic symptoms present. She described her mood is good and her affect is labile. She is easily distracted at times. There no tremors or shakes present. She's alert and oriented x3. Her insight and judgment is fair. Her impulse control is okay.  Assessment Axis I bipolar disorder , polysubstance dependence  Axis II deferred  Axis III see medical history Axis IV moderate Axis V 60-65  Plan I talked to the patient in length about her current symptoms I do believe patient is committed to get better and like to continue her therapy and medication management. She does not want to change her medication and at this time not entrusted to do any serial IAP program. I will continue her Seroquel and Cymbalta. I have a lengthy discussion about continued use  of drugs alcohol and illegal substances on her psychiatric illness. Also talked about interaction with psychotropic medication and illegal substances. Patient promised and agreed that she will stop these substances. I will do blood work on her next visit. I recommended to call  us if she is any question or concern about the medication or if she could worsening of her symptoms. Safety plan discussed in detail. I will see her again in 3-4 weeks Time spent 30 minutes

## 2011-03-17 ENCOUNTER — Ambulatory Visit (INDEPENDENT_AMBULATORY_CARE_PROVIDER_SITE_OTHER): Payer: Self-pay | Admitting: Marriage and Family Therapist

## 2011-03-17 DIAGNOSIS — F3132 Bipolar disorder, current episode depressed, moderate: Secondary | ICD-10-CM

## 2011-03-17 DIAGNOSIS — F1020 Alcohol dependence, uncomplicated: Secondary | ICD-10-CM

## 2011-03-17 DIAGNOSIS — F1210 Cannabis abuse, uncomplicated: Secondary | ICD-10-CM

## 2011-03-17 DIAGNOSIS — F1110 Opioid abuse, uncomplicated: Secondary | ICD-10-CM

## 2011-03-17 NOTE — Progress Notes (Signed)
    INDIVIDUAL SESSION  Program: Individual Therapy  Time:  1:00 - 2:00 p.m.  Participation Level: Active  Behavioral Response: Appropriate  Type of Therapy:  Individual Therapy  Summary of Progress:  Patient reports she has continued to feel depressed and not trusting her live-in boyfriend.  She talked about "how do I end up letting people take advantage of me?"  She did not talk about going to the emergency room due to taking opiates, benzos, smoking marijuana and drinking and the fact that she did not go to the emergency room with a suspected seizure (per Dr. Sheela Stack advice).  Patient states she did not have a ride.  She did state that she went to one NA meeting.  Discussed AA/NA involvement in mandatory to come to this therapist as well as patient needing to follow treatment recommendations.  Told patient that in order to continue in therapy here, she needed to be assessed for admission to CD IOP.  Patient agreed that she would get assessed on 03/19/11.  DIAGNOSIS:  I.  Bipolar disorder, mixed      Alcohol dependence,      Opiate, Marijuana, and Benzodiazepine abuse  Roger Kettles, LMFT

## 2011-03-19 ENCOUNTER — Ambulatory Visit (HOSPITAL_COMMUNITY)
Admission: RE | Admit: 2011-03-19 | Discharge: 2011-03-19 | Disposition: A | Discharge status: Home / Self Care | Payer: Self-pay | Attending: Psychiatry | Admitting: Psychiatry

## 2011-03-19 DIAGNOSIS — F39 Unspecified mood [affective] disorder: Secondary | ICD-10-CM | POA: Insufficient documentation

## 2011-03-19 DIAGNOSIS — F1010 Alcohol abuse, uncomplicated: Secondary | ICD-10-CM | POA: Insufficient documentation

## 2011-03-19 NOTE — BH Assessment (Signed)
Assessment Note   [name] is an 51 y.o. female. PT PRESENTS REQUESTING HELP TO DEAL WITH HER ETOH & DEPRESSION . PT WAS SENT BY HER THERAPIST TO GET INTO THE CD IOP. PT SAYS SHE HAS BEEN DOING FINE BUT NEEDED HELP WITH BOUTS OF DEPRESSION. PT SAYS SHE TENDS TO DRINK WHEN UNDER STRESS & DRANK 12 PK OF BEER BUT HAS NOT DRANK IN 3 WEEKS. PT SAYS SHE HAS BEEN SCREWED OVER BY MEN & THERE HAS BEEN CONFLICT WITH BF BUT CONSIDERS HIM HER SUPPORT. PT DENIES ANY CURRENT IDEATION. PT ADMITS TO A PAST HX OF BLACKOUT & SOME SEIZURE IN 2004 & 2 WEEKS AGO. PT IS ABLE TO CONTRACT FOR SAFETY.   Axis I: Alcohol Abuse and Mood Disorder NOS Axis II: Deferred Axis III:  Past Medical History  Diagnosis Date  . Depression    Axis IV: economic problems, other psychosocial or environmental problems and problems related to social environment Axis V: 51-60 moderate symptoms  Past Medical History:  Past Medical History  Diagnosis Date  . Depression     No past surgical history on file.  Family History: No family history on file.  Social History:  does not have a smoking history on file. She does not have any smokeless tobacco history on file. She reports that she drinks alcohol. Her drug history not on file.  Additional Social History:    Allergies:  Allergies  Allergen Reactions  . Sulfa Antibiotics Anaphylaxis    Home Medications:  Medications Prior to Admission  Medication Sig Dispense Refill  . DULoxetine (CYMBALTA) 30 MG capsule Take 1 capsule (30 mg total) by mouth daily.  30 capsule  1  . QUEtiapine (SEROQUEL) 200 MG tablet Take 1 tablet (200 mg total) by mouth at bedtime.  30 tablet  1   No current facility-administered medications on file as of 03/19/2011.    OB/GYN Status:  No LMP recorded. Patient is not currently having periods (Reason: Perimenopausal).  General Assessment Data Location of Assessment: [facility] Living Arrangements: Non-Relatives Can pt return to  current living arrangement?: Yes Admission Status: Voluntary Is patient capable of signing voluntary admission?: Yes Transfer from: Home Referral Source: Self/Family/Friend     Risk to self Suicidal Ideation: No Suicidal Intent: No Is patient at risk for suicide?: No Suicidal Plan?: No Access to Means: No What has been your use of drugs/alcohol within the last 12 months?: ETOH USE STARTED AT A YOUNG AGE & DRINKS 12 PK OF BEER DAILY & LAST USE WAS 3 WEEKS AGO Previous Attempts/Gestures: No How many times?: 0  Other Self Harm Risks: NA Triggers for Past Attempts: Unpredictable Intentional Self Injurious Behavior: None Family Suicide History: Unknown Recent stressful life event(s): Conflict (Comment);Financial Problems Persecutory voices/beliefs?: No Depression: No Depression Symptoms: Loss of interest in usual pleasures Substance abuse history and/or treatment for substance abuse?: Yes Suicide prevention information given to non-admitted patients: Not applicable  Risk to Others Homicidal Ideation: No Thoughts of Harm to Others: No Current Homicidal Intent: No Current Homicidal Plan: No Access to Homicidal Means: No Identified Victim: NA History of harm to others?: No Assessment of Violence: None Noted Violent Behavior Description: COOPERATIVE, PLEASANT Does patient have access to weapons?: Yes (Comment) Criminal Charges Pending?: No Does patient have a court date: No  Psychosis Hallucinations: None noted Delusions: None noted  Mental Status Report Appear/Hygiene: Body odor;Disheveled Eye Contact: Good Motor Activity: Freedom of movement;Hyperactivity Speech: Logical/coherent Level of Consciousness: Alert Mood: Anhedonia Affect: Appropriate  to circumstance Anxiety Level: None Thought Processes: Coherent;Relevant Judgement: Unimpaired Orientation: Person;Place;Time;Situation Obsessive Compulsive Thoughts/Behaviors: None  Cognitive Functioning Concentration:  Decreased Memory: Recent Intact;Remote Intact IQ: Average Insight: Fair Impulse Control: Fair Appetite: Poor Weight Loss: 0  Weight Gain: 0  Sleep: Decreased Total Hours of Sleep: 5  Vegetative Symptoms: None  Prior Inpatient Therapy Prior Inpatient Therapy: Yes Prior Therapy Dates: 2010, FEW YEARS  AGO Prior Therapy Facilty/Provider(s): [facility](2X); [facility] Reason for Treatment: STABILIZATION  Prior Outpatient Therapy Prior Outpatient Therapy: Yes Prior Therapy Dates: CURRENT Prior Therapy Facilty/Provider(s): [facility]; DR. Lolly Mustache Reason for Treatment: MED MANAGEMENT & THERAPY                     Additional Information 1:1 In Past 12 Months?: No CIRT Risk: No Elopement Risk: No Does patient have medical clearance?: Yes     Disposition:  Disposition Disposition of Patient: Outpatient treatment Type of outpatient treatment: Chemical Dependence - Intensive Outpatient  On Site Evaluation by:   Reviewed with Physician:     Waldron Session 03/19/2011 2:15 PM

## 2011-03-23 ENCOUNTER — Ambulatory Visit (HOSPITAL_COMMUNITY): Payer: Self-pay | Admitting: Marriage and Family Therapist

## 2011-03-30 ENCOUNTER — Ambulatory Visit (INDEPENDENT_AMBULATORY_CARE_PROVIDER_SITE_OTHER): Payer: Self-pay | Admitting: Psychiatry

## 2011-03-30 ENCOUNTER — Encounter (HOSPITAL_COMMUNITY): Payer: Self-pay | Admitting: Psychiatry

## 2011-03-30 DIAGNOSIS — F319 Bipolar disorder, unspecified: Secondary | ICD-10-CM

## 2011-03-30 DIAGNOSIS — F1010 Alcohol abuse, uncomplicated: Secondary | ICD-10-CM

## 2011-03-30 MED ORDER — QUETIAPINE FUMARATE 200 MG PO TABS: 200.0000 mg | ORAL_TABLET | Freq: Every day | ORAL | Status: DC

## 2011-03-30 NOTE — Progress Notes (Signed)
Chief complaint I'm doing much better  History of presenting illness Patient is 51 year old Caucasian female who came for her followup appointment. She was last seen 3 weeks ago, status post patient overdose on medication and seemed ER. Patient now taking her Seroquel and Cymbalta regularly. Patient reported increase to medication and better relationship with her boyfriend. She is sleeping better. She denies any side effects of medication. She recently received shipment of Cymbalta from from a surgical company. She is ready to leave since she does not have to take and she is also improved for Seroquel shipment through pharmaceutical company. Overall her mood has been stable and she is sleeping 6-7 hours without any problem. She denies any agitation anger or mood swings. She was assessed for CD IOP program however she was referred to continue med management and individual therapy in this office. Patient reported no side effects of medication. She denies any recent anxiety attack. She has not been drinking or using drugs since she was released from ER almost a month ago. She likes to continue therapy in this office.   Past psychiatric history Patient has long history of psychiatric illness with multiple hospitalization. Most of her admission is due to alcohol and drug-related issues. Patient is diagnosed with bipolar disorder. She recently tried to overdose on her medication but she denies that it was a suicidal attempt. She was intoxicated at that time.  Family history Patient endorse most of her family member has history of drug and alcohol. Psychosocial history Patient is born and raised in [place]. Currently she lives with her boyfriend. Patient has 3 previous marriage which are failed. Patient has 53 year old daughter who lives with her father. Patient does not have custody however recently she is trying to get custody. Patient has not able to indicate with her daughter a regular basis. 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 She has GED education  Alcohol and drug history Patient endorsed significant history of using cocaine marijuana and alcohol. She had repeated hospitalization for alcohol intoxication and drug use.  Medical history Patient has history of arthritis however she does not take any medication.  Mental status examination Patient is middle-aged woman who is casually dressed and fairly groomed. She is calm cooperative and pleasant. Her speech remains fast pressured and rapid but coherent. She denies any active or passive suicidal thoughts or homicidal thoughts. She maintained fair eye contact. Her attention and concentration is improved from the past. She denies any auditory or visual hallucination. There no psychotic symptoms present. She described her mood is good and her affect is mood congruent. There no tremors or shakes present. She's alert and oriented x3. Her insight and judgment is fair. Her impulse control is okay.  Assessment Axis I bipolar disorder , polysubstance dependence  Axis II deferred  Axis III see medical history Axis IV moderate Axis V 60-65  Plan I will continues her Cymbalta and Seroquel. Patient is very relief as these medicines are authorized through [employer]. She already received Cymbalta for 4 months but needed new prescription of Seroquel so that she can send to pharmaceutical company.  I have a lengthy discussion about continued use of drugs alcohol and illegal substances on her psychiatric illness. Also talked about interaction with psychotropic medication and illegal substances. Patient promised and agreed that she will stop these substances. I will do blood work on her next visit. I recommended to call us if she is any question or concern about the  medication or if she could worsening of her symptoms. Safety plan discussed in detail. I will see her again in 6 weeks Time spent 30 minutes

## 2011-04-02 ENCOUNTER — Ambulatory Visit (HOSPITAL_COMMUNITY): Payer: Self-pay | Admitting: Marriage and Family Therapist

## 2011-04-06 ENCOUNTER — Ambulatory Visit (HOSPITAL_COMMUNITY): Payer: Self-pay | Admitting: Marriage and Family Therapist

## 2011-04-06 ENCOUNTER — Ambulatory Visit (INDEPENDENT_AMBULATORY_CARE_PROVIDER_SITE_OTHER): Payer: Self-pay | Admitting: Marriage and Family Therapist

## 2011-04-06 DIAGNOSIS — F1020 Alcohol dependence, uncomplicated: Secondary | ICD-10-CM

## 2011-04-06 DIAGNOSIS — F3162 Bipolar disorder, current episode mixed, moderate: Secondary | ICD-10-CM
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
## 2011-04-10 ENCOUNTER — Other Ambulatory Visit (HOSPITAL_COMMUNITY): Payer: Self-pay | Attending: Physician Assistant | Admitting: Psychology

## 2011-04-10 DIAGNOSIS — F1920 Other psychoactive substance dependence, uncomplicated: Secondary | ICD-10-CM | POA: Insufficient documentation

## 2011-04-10 DIAGNOSIS — F1010 Alcohol abuse, uncomplicated: Secondary | ICD-10-CM | POA: Insufficient documentation

## 2011-04-10 DIAGNOSIS — F319 Bipolar disorder, unspecified: Secondary | ICD-10-CM | POA: Insufficient documentation

## 2011-04-13 ENCOUNTER — Other Ambulatory Visit (HOSPITAL_COMMUNITY): Payer: Self-pay | Admitting: Psychology

## 2011-04-13 DIAGNOSIS — F1920 Other psychoactive substance dependence, uncomplicated: Secondary | ICD-10-CM

## 2011-04-14 ENCOUNTER — Ambulatory Visit (HOSPITAL_COMMUNITY): Payer: Self-pay | Admitting: Marriage and Family Therapist

## 2011-04-14 LAB — PRESCRIPTION ABUSE MONITORING 17P, URINE
Benzodiazepine Screen, Urine: NEGATIVE ng/mL
Cannabinoid Scrn, Ur: POSITIVE ng/mL — ABNORMAL HIGH
Carisoprodol, Urine: NEGATIVE ng/mL
Cocaine Metabolites: NEGATIVE ng/mL
Meperidine, Ur: NEGATIVE ng/mL
Methadone Screen, Urine: NEGATIVE ng/mL
Opiate Screen, Urine: NEGATIVE ng/mL
Tapentadol, urine: NEGATIVE ng/mL
Tramadol Scrn, Ur: NEGATIVE ng/mL

## 2011-04-14 NOTE — Progress Notes (Signed)
    Daily Group Progress Note  Program: CD-IOP   Group Time: 1-2:30 pm  Participation Level: Active  Behavioral Response: Appropriate  Type of Therapy: Psycho-education Group  Topic: Treatment for a BUD: first half of group spent in psycho-education. A presentation was provided on identifying when one is in a "BUD", or building up to drinking/drugging. There are identifiable changes that many go through prior to relapse and these changes in thinking, attitudes, and behaviors were identified and discussed. Members shared what they have done in the past to avoid relapse and the importance of 'talking to someone' was reiterated. The message from the session was that actually drinking/drugging is the last thing that happens in this process and there is time to disrupt the use.   Group Time: 2:45-4 pm  Participation Level: Active  Behavioral Response: Sharing  Type of Therapy: Process Group  Topic: Process: second half of group spent in process. Members shared their frustrations and issues in early recovery. A new member was present and he was invited by the group to share a little of what brings him to this program. He was very open about his struggles with Klonipin and the terrible withdrawal he had gone through. There was good disclosure among group members and excellent feedback and support to the newer group members.    Summary: The patient was attentive and engaged actively in the session. She reported she had had a good weekend and had remained drug-free. She shared a little about her life and noted she had spoken for the first time in a few months to her 50 yo daughter. She admitted she had been very deep into her addiction when she and the child's father divorced and he had received full custody. The patient made some good comments and displayed good motivation for her recovery.    Family Program: Family present? No   Name of family member(s):   UDS collected: Yes Results: not  available  AA/NA attended?: No  Sponsor?: No   Ewa Hipp, LCAS
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
## 2011-04-15 ENCOUNTER — Other Ambulatory Visit (HOSPITAL_COMMUNITY): Payer: Self-pay | Admitting: Psychology

## 2011-04-16 LAB — CANNABANOIDS (GC/LC/MS), URINE: THC-COOH (GC/LC/MS), ur confirm: 22 NG/ML — ABNORMAL HIGH

## 2011-04-16 NOTE — Progress Notes (Signed)
    Daily Group Progress Note  Program: CD-IOP   Group Time: 1-2:30   Participation Level: Active  Behavioral Response: Appropriate  Type of Therapy: Psycho-education Group  Topic: Criteria for Substance Dependency Diagnosis: First half of group spent discussing the handout identifying the 7 criteria for Substance Dependence as found in the DSM-IV. Each of the criteria was discussed at length and a number of the group members admitted they met all 7 criteria. The newest group member was asked whether he met at least of the 3 criteria and he confessed that he met more than 3. He had previously questioned whether he was addicted or just abusing the Klonipin. There was good disclosure among the group members.     Group Time: 2:45-4 pm  Participation Level: Active  Behavioral Response: Sharing  Type of Therapy: Activity   Topic: Family Sculpture: second half of group spent sculpting group members' families. The member was asked to explain the meaning behind his/her sculpture and the group members used in the sculpture also shared their feelings. The session explained some of the current struggles that the group members are dealing with and the group talked about core beliefs and how they are created and continue to play out in our adult lives. There was a lot of emotion during the sculptures and greater insight provided by viewing these families through the eyes of the family member.    Summary: The patient admitted that she had met all 7 criteria for the diagnosis of Substance Dependence. She reported she doesn't have a license right now and only needs to pay the fines to get it back. In the meantime, though, she reported she has had opportunities to drive, but has refused them. In the second half of group, the patient noted that she has developed a lot of negative core beliefs and wondered how one let go of those negative thoughts? She received good feedback and responded well to this  intervention. She remains drug and alcohol free.    Family Program: Family present? No   Name of family member(s):   UDS collected: No Results:   AA/NA attended?: No  Sponsor?: No   Maymunah Stegemann, LCAS

## 2011-04-17 ENCOUNTER — Other Ambulatory Visit (HOSPITAL_COMMUNITY): Payer: Self-pay

## 2011-04-20 ENCOUNTER — Other Ambulatory Visit (HOSPITAL_COMMUNITY): Payer: Self-pay | Admitting: Psychology

## 2011-04-20 ENCOUNTER — Ambulatory Visit (HOSPITAL_COMMUNITY): Payer: Self-pay | Admitting: Marriage and Family Therapist
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
## 2011-04-22 ENCOUNTER — Other Ambulatory Visit (HOSPITAL_COMMUNITY): Payer: Self-pay | Admitting: Psychology

## 2011-04-22 ENCOUNTER — Encounter (HOSPITAL_COMMUNITY): Payer: Self-pay | Admitting: Psychology

## 2011-04-22 DIAGNOSIS — F1920 Other psychoactive substance dependence, uncomplicated: Secondary | ICD-10-CM

## 2011-04-23 LAB — PRESCRIPTION ABUSE MONITORING 17P, URINE
Benzodiazepine Screen, Urine: NEGATIVE ng/mL
Cocaine Metabolites: NEGATIVE ng/mL
Creatinine, Urine: 8.81 mg/dL
Fentanyl, Ur: NEGATIVE ng/mL
MDMA URINE: NEGATIVE ng/mL
Meperidine, Ur: NEGATIVE ng/mL
Methadone Screen, Urine: NEGATIVE ng/mL
Propoxyphene: NEGATIVE ng/mL
Tapentadol, urine: NEGATIVE ng/mL
Tramadol Scrn, Ur: NEGATIVE ng/mL
Zolpidem, Urine: NEGATIVE ng/mL

## 2011-04-23 NOTE — Progress Notes (Signed)
Patient ID: [name], female   DOB: 21-May-1960, 51 y.o.   MRN: [id number] Treatment Planning Session: met with the patient this afternoon at the conclusion of her CD-IOP group session. I explained the purpose of identifying goals for treatment. She agreed completely with the goal of remaining sober and building her support network. The patient reported one of her best friends is also in recovery and they have gone to meetings together. The patient reported she already feels a lot better with a clear head. When asked about other goals for treatment, the patient admitted that she has a terrible self-esteem and has compromised herself for most of her life to accomodate other people. She asked that we work on this and help her to build up her sense of self and develop more confidence. The patient reported she hasn't dealt with the grief and loss in her life and this has only led to more drinking and drugging. She is feeling better and currently prescribed Seroquel and Cymbalta.The documentation was completed the the treatment plan identified accordingly. Will continue to follow closely and work with this patient on a weekly basis in individual therapy sessions.
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
## 2011-04-24 ENCOUNTER — Other Ambulatory Visit (HOSPITAL_COMMUNITY): Payer: Self-pay | Admitting: Psychology

## 2011-04-27 ENCOUNTER — Other Ambulatory Visit (HOSPITAL_COMMUNITY): Payer: Self-pay | Admitting: Psychology
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
## 2011-04-28 NOTE — Progress Notes (Signed)
    Daily Group Progress Note  Program: CD-IOP   Group Time: 1-2:30 pm  Participation Level: Active  Behavioral Response: Appropriate  Type of Therapy: Psycho-education Group  Topic: Self-Care in Early Recovery; Presentation on the importance of self-care in early recovery was provided. Included in this presentation was a handout that consisted of a check-list worksheet. Members were asked to rate the frequency of many different areas of their life. Members identified different elements of self-care and those were recorded on the board. There was a wide variety of responses to questions on self-care and it was clear that most group member's stability in early recovery would benefit from an increased emphasis on their self-care.  Group Time: 2:45-4 pm  Participation Level: Active  Behavioral Response: Sharing and Assertive  Type of Therapy: Process Group  Topic: Process; second half of group spent in process. Members shared some of their current struggles and challenges. Members who had not shared in the first part of group were asked to check-in. A PA from [facility] who is completing her rotation was also present in group today and she provided good insight and information relevant to the group discussion.   Summary: The patient shared that she worked out by doing isometrics and some barbells. When asked about her diet today, the patient admitted she had not had anything, but coffee. I pointed out that her blood sugar level would be crashing if it already hadn't. This led to a brief but effective discussion on how people feel when they haven't eaten and the poor decision making that can occur when someone is so vulnerable. She also admitted having eaten 2 pieces of chocolate cake and a bag of chips last night. In process, this patient encouraged another group member to do light exercise. She explained that she suffered a lot of arthritis and she felt like exercise helped her deal better  with the pain and reduce it as well. She also noted that Cymbalta helped her with pain. The patient has some good recommendations and shared openly. She remains sober and is making good progress in her early recovery.    Family Program: Family present? No   Name of family member(s):   UDS collected: No Results:   AA/NA attended?: No  Sponsor?: No   Cherissa Hook, LCAS

## 2011-04-29 ENCOUNTER — Other Ambulatory Visit (HOSPITAL_COMMUNITY): Payer: Self-pay

## 2011-04-30 ENCOUNTER — Ambulatory Visit (HOSPITAL_COMMUNITY): Payer: Self-pay | Admitting: Marriage and Family Therapist

## 2011-05-01 ENCOUNTER — Other Ambulatory Visit (HOSPITAL_COMMUNITY): Payer: Self-pay | Admitting: Psychology

## 2011-05-04 ENCOUNTER — Other Ambulatory Visit (HOSPITAL_COMMUNITY): Payer: Self-pay

## 2011-05-04 NOTE — Progress Notes (Signed)
    Daily Group Progress Note  Program: CD-IOP    Group Time: 1-2:30 pm  Participation Level: Active  Behavioral Response: Appropriate  Type of Therapy: Activity Group  Topic: Yoga: first half of group spent in a yoga session. A certified yoga instructor, Jari Favre, was here today to lead the session. Members responded well to the "[facility]" and the session went very well.  Group Time: 2:45-4pm  Participation Level: Minimal  Behavioral Response: Sharing  Type of Therapy: Process Group  Topic: Group Process: second half of group spent in process. Members talked about their current concerns and issues. At one point, members challenged another member after he reported having attended at least 4 12-step meetings. Upon further review, it was quite clear that he had not attended any meetings. There was good feedback and disclosure among the group.  Summary: The patient appeared to enjoy the yoga despite not feeling well. The patient reported she had spent the last 2 days in bed, but today she didn't have a temperature. She reported her entire body ached and she just covered up with her quilt and slept. She reported if she feels better she will attend church this weekend. She is nearing her 30 days of sobriety, but reminded the group that she once had 5 years of total abstinence. The patient is making good progress and displayed a strong commitment to the group by coming her when she could have easily stayed home in bed.    Family Program: Family present? No   Name of family member(s):   UDS collected: No Results:   AA/NA attended?: [place]  Sponsor?: No   Nevea Spiewak, LCAS

## 2011-05-06 ENCOUNTER — Other Ambulatory Visit (HOSPITAL_COMMUNITY): Payer: Self-pay | Admitting: Psychology

## 2011-05-08 ENCOUNTER — Other Ambulatory Visit (HOSPITAL_COMMUNITY): Payer: Self-pay

## 2011-05-09 NOTE — Progress Notes (Signed)
    Daily Group Progress Note  Program: CD-IOP   Group Time: 1-2:30 pm  Participation Level: Minimal  Behavioral Response: Sharing  Type of Therapy: Psycho-education Group  Topic: Self-Esteem: a presentaton was provided on improving and strengthening one's self-esteem. The damage that addiction causes to one's sense of self was discussed and members agreed that the guilt and shame they have felt for their actions have made them feel very badly about themselves. A list of things one can do to begin to build esteem was discussed with members writing down their repsonses to each of suggestion offered. The ability to identify one's strengths or positive attributes differed among the group. Some of the members with the lowest self-esteem seemed unable to identify their good qualities while others could list many positive attributes.  Group Time: 2:45- 4pm  Participation Level: Active  Behavioral Response: Sharing  Type of Therapy: Process Group  Topic: Group Process: the second half of group was spent in process. Members discussed their current issues and struggles. The group identified their plans for the upcoming Easter holiday and those plans that support and validate an abstinence-based lifestyle.  Summary:The patient admitted she continues to feel badly and has been sick for over a week. She reported she aches all over and when she took a shower the other day the water on her skin brought tears to her eyes. The patient was able to identify some good qualities about herself and agreed that her addiction had really been destructive to her sense of self. She remains sober and is doing very well in her recovery. She provided good feedback to her fellow group members.    Family Program: Family present? No   Name of family member(s):   UDS collected: No Results:   AA/NA attended?: No  Sponsor?: No   Sowmya Partridge, LCAS

## 2011-05-11 ENCOUNTER — Encounter (HOSPITAL_COMMUNITY): Payer: Self-pay | Admitting: Psychology

## 2011-05-11 ENCOUNTER — Other Ambulatory Visit (HOSPITAL_COMMUNITY): Payer: Self-pay | Attending: Physician Assistant

## 2011-05-11 DIAGNOSIS — F319 Bipolar disorder, unspecified: Secondary | ICD-10-CM | POA: Insufficient documentation

## 2011-05-11 DIAGNOSIS — Z6379 Other stressful life events affecting family and household: Secondary | ICD-10-CM | POA: Insufficient documentation

## 2011-05-11 DIAGNOSIS — M129 Arthropathy, unspecified: Secondary | ICD-10-CM | POA: Insufficient documentation

## 2011-05-11 DIAGNOSIS — F1920 Other psychoactive substance dependence, uncomplicated: Secondary | ICD-10-CM | POA: Insufficient documentation

## 2011-05-11 DIAGNOSIS — F1010 Alcohol abuse, uncomplicated: Secondary | ICD-10-CM | POA: Insufficient documentation

## 2011-05-12 NOTE — Progress Notes (Signed)
Patient ID: [name], female   DOB: Dec 28, 1960, 51 y.o.   MRN: [id number] Patient did not appear for group today nor did she phone to explain her absence. She has been sick with the flu and not felt well for over a week, but she has phoned to explain previous absences. This absence is unexcused.

## 2011-05-13 ENCOUNTER — Other Ambulatory Visit (HOSPITAL_COMMUNITY): Payer: Self-pay | Admitting: Psychology

## 2011-05-14 NOTE — Progress Notes (Signed)
    Daily Group Progress Note  Program: CD-IOP   Group Time: 1-2:30 pm  Participation Level: Active  Behavioral Response: Appropriate  Type of Therapy: Psycho-education Group  Topic: "The Importance of Taking the Medicine". First half of group was a presentation on doing what one must do to live with a chronic illness. The importance of "taking the medicine", i.e., making major behavioral changes in one's daily life was emphasized. While some people maintain they can "think" their way through their addiction, most of those same people have demonstrated they can't by relapsing. Group members were asked to identify the "medication" of recovery. It includes: attending meetings, working the steps with sponsor, fellowship, prayer and reflection, taking one's medication (literally), exercise and a healthy diet. Members shared their own efforts to "do it my way" and in each instance, they had failed miserably.  Group Time: 2:45- 4pm  Participation Level: Active  Behavioral Response: Sharing, Rationalizing and Minimizing  Type of Therapy: Process Group  Topic: Opiates: a PowerPoint presentation and ensuing discussion followed the break. A brief overview was provided on the history of opioids and current drugs of abuse. One of the members recently observed her 90th day of sobriety from IV drug use and she shared openly about her heroin use. The progressive nature of addiction was identified and the changes in behaviors - all based on obtaining and using the drugs. The session was well-received with good feedback among the group.  Summary:The patient apologized for not having come to group on Monday and explained she has continued to struggle with this bug she has had for almost 2 weeks. She reported she has not used, but admitted her S/O, with whom she lives, is still drinking and there is a bottle of Nutritional therapist in the house. She insisted that this wasn't a problem for her despite expressed concern  and feedback from group members. She reported he had come home on Monday and brought a "blue" six pack for her and a red one for him. She explained the blue one for her was Pepsi, while the red one for him was a six-pack of Budweiser. She reminded the group that she only drinks when she is very depressed, but had little to say when I noted that she cannot just eliminate depression or sadness from her life. The patient seems unwilling to address the obvious problems in her life, including a heavily drinking S/O and such little support for her sobriety.   Family Program: Family present? No   Name of family member(s):   UDS collected: No Results:  AA/NA attended?: No  Sponsor?: No   Aliz Meritt, LCAS

## 2011-05-15 ENCOUNTER — Other Ambulatory Visit (HOSPITAL_COMMUNITY): Payer: Self-pay | Admitting: Psychology

## 2011-05-18 ENCOUNTER — Other Ambulatory Visit (HOSPITAL_COMMUNITY): Payer: Self-pay | Admitting: Psychology

## 2011-05-19 NOTE — Progress Notes (Signed)
    Daily Group Progress Note  Program: CD-IOP   Group Time: 1-2:30 pm  Participation Level: Active  Behavioral Response: Appropriate  Type of Therapy: Psycho-education Group  Topic:The Stimulant Drugs: a PowerPoint presentation was provided on the category of drugs known as stimulants. The focus was on cocaine and methamphetamine. Included in the session was a detailed description of the characteristics or symptoms of these drugs and the physiological and psychological changes they cause. There was a lively discussion among the group during the presentation as a number of members had been very heavy stimulant users, including cocaine and adder all. It proved to be a very informative session.   Group Time: 2:45- 4pm  Participation Level: Active  Behavioral Response: Sharing  Type of Therapy: Process Group  Topic: Process and Graduation: second half of group was spent in process. Members shared some of their current struggles and concerns. As the session neared the end, a graduation ceremony was held honoring 2 members who were successfully completing treatment today. There were kind words and a few tears and members said their good-byes and well wishes.  Summary: The patient reported she is still feeling sick, but is getting over whatever bug has had her down for over 2 weeks. The patient said little in the first half of group and did not disclose whether she had ever used cocaine or meth. In process, she shared some kind words with the graduating members and responded well to this intervention.   Family Program: Family present? No   Name of family member(s):   UDS collected: No Results:   AA/NA attended?: No  Sponsor?: No   Zaidin Blyden, LCAS

## 2011-05-19 NOTE — Progress Notes (Signed)
    Daily Group Progress Note  Program: CD-IOP   Group Time: 1-2:30 pm  Participation Level: Active  Behavioral Response: Appropriate  Type of Therapy: Psycho-education Group  Topic:The patient joined the discussion on stimulants, but admitted she had not taken them herself. The patient was one of the graduating members and she received some wonderful accolades from her fellow group members and shared her own appreciation for all that she had learned during the program. The patient has made excellent progress and leaves the program with almost 90 days of sobriety. She responded well to this intervention.   Group Time: 2:45- 4pm  Participation Level: Minimal  Behavioral Response: Sharing  Type of Therapy: Process Group  Topic: The patient joined the discussion on stimulants, but admitted she had not taken them herself. The patient was one of the graduating members and she received some wonderful accolades from her fellow group members and shared her own appreciation for all that she had learned during the program. The patient has made excellent progress and leaves the program with almost 90 days of sobriety. She responded well to this intervention.   Summary: The patient was attentive and admitted that she does share about her feelings in this group. She reported she does not normally talk at all about herself and had become very isolated and withdrawn over the past couple of years. She encouraged the new group member to talk about what he was experiencing and reminded him that the group is not judging anybody and everyone is here to support each other. The patient reported she is finally begin to feel better after struggling with some sort of bug for 2 weeks. She made some good comments and responded well to this intervention.    Family Program: Family present? No   Name of family member(s):   UDS collected: No Results:   AA/NA attended?: No  Sponsor?: No   Kenroy Timberman,  LCAS

## 2011-05-20 ENCOUNTER — Encounter (HOSPITAL_COMMUNITY): Payer: Self-pay | Admitting: Psychology

## 2011-05-20 ENCOUNTER — Other Ambulatory Visit (HOSPITAL_COMMUNITY): Payer: Self-pay | Admitting: Psychology

## 2011-05-22 ENCOUNTER — Other Ambulatory Visit (HOSPITAL_COMMUNITY): Payer: Self-pay

## 2011-05-22 ENCOUNTER — Encounter (HOSPITAL_COMMUNITY): Payer: Self-pay | Admitting: Psychology

## 2011-05-22 NOTE — Progress Notes (Signed)
Patient ID: [name], female   DOB: August 18, 1960, 51 y.o.   MRN: [id number] Treatment Plan Update: met with this patient at the conclusion of her CD-IOP group today. She remains sober, but had admitted in group that she is craving a Budweiser today. The patient has had years of sobriety in the past - 5 years - and openly admits she is an alcoholic. She has, however, attended only a very few AA meetings since joining the program and admitted she doesn't like them. The patient reminded me that everyone she knows drinks or drugs and she can't allow her addiction to define her. I pointed out our belief on the importance of recovery support with other addicts, but she insisted that she knows what she needs to do and has plenty of support. Regarding her 3rd goal of treatment - to improve her self-esteem, the patient reported she does feel better about herself and ongoing sobriety is the single most important factor in how she feels about herself. She reported she has stopped taking the Seroquel because it was making her lose circulation in her hands and feet. She insisted it was causing the problem, "because it goes away when I stop taking it". she is on Cymbalta - 30 mg - which is not as high as she has been on in the past. I agreed to speak with the Medical Director about her dosage. Documentation was reviewed and the update completed successfully. Although this patient remains sober, she is not building the type of support network we would like to see and I believe her sobriety has little foundation to support her should she struggle with more problems. Will continue to follow closely.

## 2011-05-22 NOTE — Progress Notes (Signed)
    Daily Group Progress Note  Program: CD-IOP   Group Time: 1-2:30 pm  Participation Level: Active  Behavioral Response: Sharing  Type of Therapy: Psycho-education Group  Topic: Chaplain: first half of group was spent with the visiting Chaplain. He talked about Hope and the group was asked to share their feelings about hope. The conversation steered to how one finds hope while struggling with a loss of hope. There was good disclosure among group members and sharing about how they have found reasons to go on when there weren't any.    Group Time: 2:45-4 pm  Participation Level: Active  Behavioral Response: Appropriate and Sharing  Type of Therapy: Process Group  Topic: Group Process: the second half of group was spent in process. Members shared their current struggles and issues in early recovery. There was good discussion and feedback among members.  Summary:Treatment plan update: met with this patient at the conclusion of her CD-IOP group today. She remains sober, but had admitted in group that she is craving a Budweiser today. The patient has had years of sobriety in the past - 5 years - and openly admits she is an alcoholic. She has, however, attended only a very few AA meetings since joining the program and admitted she doesn't like them. The patient reminded me that everyone she knows drinks or drugs and she can't allow her addiction to define her. I pointed out our belief on the importance of recovery support with other addicts, but she insisted that she knows what she needs to do and has plenty of support. Regarding her 3rd goal of treatment - to improve her self-esteem, the patient reported she does feel better about herself and ongoing sobriety is the single most important factor in how she feels about herself. She reported she has stopped taking the Seroquel because it was making her lose circulation in her hands and feet. She insisted it was causing the problem, "because it  goes away when I stop taking it". she is on Cymbalta - 30 mg - which is not as high as she has been on in the past. I agreed to speak with the Medical Director about her dosage. Documentation was reviewed and the update completed successfully. Although this patient remains sober, she is not building the type of support network we would like to see and I believe her sobriety has little foundation to support her should she struggle with more problems. Will continue to follow closely.    Family Program: Family present? No   Name of family member(s):   UDS collected: No Results:   AA/NA attended?: No{  Sponsor?: No   Imelda Dandridge, LCAS

## 2011-05-25 ENCOUNTER — Encounter (HOSPITAL_COMMUNITY): Payer: Self-pay | Admitting: Psychology

## 2011-05-25 ENCOUNTER — Other Ambulatory Visit (HOSPITAL_COMMUNITY): Payer: Self-pay | Admitting: Psychology

## 2011-05-25 NOTE — Progress Notes (Signed)
Patient ID: [name], female   DOB: [birth year], 51 y.o.   MRN: [id number] Patient did not phone or appear for group today. When [name] has missed group before she has phoned. This will be recorded as an unexcused absence.

## 2011-05-25 NOTE — Progress Notes (Signed)
Patient ID: [name], female   DOB: [birth year], 51 y.o.   MRN: [id number] I received an email today. It had been sent on Friday, but was in Spam and not allowed through the system. This patient had sent it through her S/O's email and it had been held up. The email informed me that she would not likely be in group for lack of a ride. The patient lives in [place] and does not drive. I will excuse from group on Friday.

## 2011-05-26 NOTE — Progress Notes (Signed)
    Daily Group Progress Note  Program: CD-IOP   Group Time: 1-2:30 pm  Participation Level: Active  Behavioral Response: Appropriate  Type of Therapy: Psycho-education Group  Topic: Open and Honest: the importance of being open and honest in group was discussed at length this afternoon. If one is to make changes and progress towards an abstinence-based lifestyle, it requires the recovering individual to become very open about what he/she is experiencing on a daily basis and being able to express that to others. It was pointed out that while some of the group members are extremely 'transparent', others do not disclose any information of depth. I emphasized that one will only benefit from the group as much as they are willing to give of themselves. The session went quite well with a number of members sharing about themselves to a degree that had been sorely lacking.   Group Time: 2:45- 4pm  Participation Level: Active  Behavioral Response: Sharing  Type of Therapy: Process Group  Topic: Group Process: members shared about their current issues and concerns. One member complained about his family's inability or refusal to acknowledge his progress. This complaint resonated with other group members. Members talked about their triggers and learning how to identify them.   Summary: The patient apologized for having missed the group on Friday. She reported she couldn't get a ride and because she lives in [place], she was unable to get into town. In response to another group member's concerns about having the freedom to drive, she pointed out that she could have her license, but has chosen not to drive or get her license because it reduces her temptation to drive to buy beer. The patient explained that this is a sacrifice she is willing to make in her life to support her abstinence. She also encouraged another group member to accept compliments and validation for the good person he is. The  patient has spoken up more about others members' unwillingness to accept their good qualities and gifts and displays a greater willingness to speak out. She made some good comments.   Family Program: Family present? No   Name of family member(s):   UDS collected: Yes Results: unavailable  AA/NA attended?: No  Sponsor?: No   Sareen Randon, LCAS

## 2011-05-27 ENCOUNTER — Other Ambulatory Visit (HOSPITAL_COMMUNITY): Payer: Self-pay

## 2011-05-29 ENCOUNTER — Other Ambulatory Visit (HOSPITAL_COMMUNITY): Payer: Self-pay

## 2011-06-01 ENCOUNTER — Other Ambulatory Visit (HOSPITAL_COMMUNITY): Payer: Self-pay

## 2011-06-01 ENCOUNTER — Ambulatory Visit (HOSPITAL_COMMUNITY): Payer: Self-pay | Admitting: Psychiatry

## 2011-06-02 ENCOUNTER — Telehealth (HOSPITAL_COMMUNITY): Payer: Self-pay | Admitting: Psychology

## 2011-06-03 ENCOUNTER — Other Ambulatory Visit (HOSPITAL_COMMUNITY): Payer: Self-pay | Admitting: Psychology

## 2011-06-03 ENCOUNTER — Other Ambulatory Visit (HOSPITAL_COMMUNITY): Payer: Self-pay | Admitting: Physician Assistant

## 2011-06-04 LAB — PRESCRIPTION ABUSE MONITORING 17P, URINE
Amphetamine/Meth: NEGATIVE ng/mL
Barbiturate Screen, Urine: NEGATIVE ng/mL
Benzodiazepine Screen, Urine: NEGATIVE ng/mL
Buprenorphine, Urine: NEGATIVE ng/mL
Carisoprodol, Urine: NEGATIVE ng/mL
Fentanyl, Ur: NEGATIVE ng/mL
MDMA URINE: NEGATIVE ng/mL
Meperidine, Ur: NEGATIVE ng/mL
Oxycodone Screen, Ur: POSITIVE ng/mL — ABNORMAL HIGH
Propoxyphene: NEGATIVE ng/mL
Zolpidem, Urine: NEGATIVE ng/mL
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
## 2011-06-05 ENCOUNTER — Other Ambulatory Visit (HOSPITAL_COMMUNITY): Payer: Self-pay

## 2011-06-08 ENCOUNTER — Telehealth (HOSPITAL_COMMUNITY): Payer: Self-pay | Admitting: Psychology

## 2011-06-08 ENCOUNTER — Other Ambulatory Visit (HOSPITAL_COMMUNITY): Payer: Self-pay | Admitting: Physician Assistant

## 2011-06-08 ENCOUNTER — Other Ambulatory Visit (HOSPITAL_COMMUNITY): Payer: Self-pay | Admitting: Psychology

## 2011-06-08 DIAGNOSIS — F1920 Other psychoactive substance dependence, uncomplicated: Secondary | ICD-10-CM

## 2011-06-08 MED ORDER — BENZTROPINE MESYLATE 1 MG PO TABS: 1.0000 mg | ORAL_TABLET | Freq: Every day | ORAL | Status: DC

## 2011-06-09 NOTE — Progress Notes (Signed)
    Daily Group Progress Note  Program: CD-IOP   Group Time: 1-2:30 pm  Participation Level: Active  Behavioral Response: Appropriate and Sharing  Type of Therapy: Activity Group  Topic: "People I Admire": First part of group spent in a strength-building exercise. Members were asked to write down 5 people they admire and the 3 characteristics or attributes that they possess for which they are to be admired. Members shared these and those identified included mostly family members, friends and neighbors and 1 rock star. The underlying meaning of this exercise is the belief that to identify a trait in someone else requires that the identifying person has the same trait - one their "best day". Members seemed to agree that many of the traits identified did belong to those identifying them. There was good sharing among the group during this session.   Group Time: 2:45- 4pm  Participation Level: Active  Behavioral Response: Sharing  Type of Therapy: Process Group  Topic:Process: second half of group was spent in process. Members shared their current issues and concerns. The group had a new member and he shared about himself. He had come directly from detox and most of the members had had detox experiences themselves. The group was very gentle with this new member and provided good support to him as he talked about his life and long term alcoholism.    Summary: The patient identified her neighbor Kathie Rhodes, who had taught her many things when she was just a youngster. The patient reminded the group that her mother had been a very depressed alcoholic and very disengaged in her daughter's life. The patient explained that Kathie Rhodes had taught her about sewing and cooking and gave her skills to do these things in her life. She reported she has spent time with her daughter this weekend and apologized to the group for having a "meltdown" last Wednesday. I insisted that sharing one's concerns and expressing  frustration is not a meltdown and was very good for her. The patient provided good feedback to the new group member and could relate to his history of alcohol and beer drinking. She made some good comments.    Family Program: Family present? No   Name of family member(s):   UDS collected: No Results:   AA/NA attended?: No  Sponsor?: No   Santhosh Gulino, LCAS

## 2011-06-10 ENCOUNTER — Other Ambulatory Visit (HOSPITAL_COMMUNITY): Payer: Self-pay | Attending: Physician Assistant

## 2011-06-10 ENCOUNTER — Encounter (HOSPITAL_COMMUNITY): Payer: Self-pay | Admitting: Psychology

## 2011-06-10 LAB — OPIATES/OPIOIDS (LC/MS-MS)
Codeine Urine: NEGATIVE NG/ML
Hydrocodone: NEGATIVE NG/ML
Hydromorphone: NEGATIVE NG/ML
Morphine Urine: NEGATIVE NG/ML
Oxycodone, ur: 46 NG/ML
Oxymorphone: 62 NG/ML

## 2011-06-11 ENCOUNTER — Telehealth (HOSPITAL_COMMUNITY): Payer: Self-pay | Admitting: Psychology

## 2011-06-11 NOTE — Progress Notes (Signed)
Patient ID: [name], female   DOB: 05-Mar-1960, 51 y.o.   MRN: [id number] The patient did not appear for group today nor did she phone to explain her absence. The drug test collected last Wednesday, the 24th, was returned today and was positive for alcohol and cannabis. Will wait to hear from her.

## 2011-06-12 ENCOUNTER — Other Ambulatory Visit (HOSPITAL_COMMUNITY): Payer: Self-pay

## 2011-06-22 ENCOUNTER — Ambulatory Visit (HOSPITAL_COMMUNITY): Payer: Self-pay | Admitting: Psychiatry

## 2011-06-24 ENCOUNTER — Encounter (HOSPITAL_COMMUNITY): Payer: Self-pay

## 2011-07-08 ENCOUNTER — Ambulatory Visit (HOSPITAL_COMMUNITY): Payer: Self-pay | Admitting: Marriage and Family Therapist

## 2011-10-09 ENCOUNTER — Encounter (HOSPITAL_COMMUNITY): Payer: Self-pay | Admitting: Licensed Clinical Social Worker

## 2011-10-09 ENCOUNTER — Telehealth (HOSPITAL_COMMUNITY): Payer: Self-pay | Admitting: Licensed Clinical Social Worker

## 2011-10-09 ENCOUNTER — Telehealth (HOSPITAL_COMMUNITY): Payer: Self-pay

## 2011-10-09 ENCOUNTER — Inpatient Hospital Stay (HOSPITAL_COMMUNITY)
Admission: RE | Admit: 2011-10-09 | Discharge: 2011-10-14 | DRG: 885 | Disposition: A | Discharge status: Home / Self Care | Payer: Federal, State, Local not specified - Other | Source: Ambulatory Visit | Attending: Psychiatry | Admitting: Psychiatry

## 2011-10-09 ENCOUNTER — Encounter (HOSPITAL_COMMUNITY): Payer: Self-pay | Admitting: *Deleted

## 2011-10-09 DIAGNOSIS — F3130 Bipolar disorder, current episode depressed, mild or moderate severity, unspecified: Principal | ICD-10-CM | POA: Diagnosis present

## 2011-10-09 DIAGNOSIS — R112 Nausea with vomiting, unspecified: Secondary | ICD-10-CM | POA: Diagnosis present

## 2011-10-09 DIAGNOSIS — R569 Unspecified convulsions: Secondary | ICD-10-CM | POA: Diagnosis present

## 2011-10-09 DIAGNOSIS — F1020 Alcohol dependence, uncomplicated: Secondary | ICD-10-CM | POA: Diagnosis present

## 2011-10-09 DIAGNOSIS — F429 Obsessive-compulsive disorder, unspecified: Secondary | ICD-10-CM | POA: Diagnosis present

## 2011-10-09 DIAGNOSIS — Z8041 Family history of malignant neoplasm of ovary: Secondary | ICD-10-CM

## 2011-10-09 DIAGNOSIS — F605 Obsessive-compulsive personality disorder: Secondary | ICD-10-CM | POA: Diagnosis present

## 2011-10-09 DIAGNOSIS — R634 Abnormal weight loss: Secondary | ICD-10-CM | POA: Diagnosis present

## 2011-10-09 DIAGNOSIS — F339 Major depressive disorder, recurrent, unspecified: Secondary | ICD-10-CM | POA: Diagnosis present

## 2011-10-09 DIAGNOSIS — F411 Generalized anxiety disorder: Secondary | ICD-10-CM | POA: Diagnosis present

## 2011-10-09 DIAGNOSIS — F17200 Nicotine dependence, unspecified, uncomplicated: Secondary | ICD-10-CM | POA: Diagnosis present

## 2011-10-09 DIAGNOSIS — M129 Arthropathy, unspecified: Secondary | ICD-10-CM | POA: Diagnosis present

## 2011-10-09 DIAGNOSIS — F1010 Alcohol abuse, uncomplicated: Secondary | ICD-10-CM | POA: Diagnosis present

## 2011-10-09 HISTORY — DX: Unspecified convulsions: R56.9

## 2011-10-09 MED ORDER — MAGNESIUM HYDROXIDE 400 MG/5ML PO SUSP: 30.0000 mL | Freq: Every day | ORAL | Status: DC | PRN

## 2011-10-09 MED ORDER — ALUM & MAG HYDROXIDE-SIMETH 200-200-20 MG/5ML PO SUSP: 30.0000 mL | Freq: Four times a day (QID) | ORAL | Status: DC | PRN

## 2011-10-09 MED ORDER — NICOTINE 21 MG/24HR TD PT24
21.0000 mg | MEDICATED_PATCH | Freq: Every day | TRANSDERMAL | Status: DC
  Administered 2011-10-09 – 2011-10-14 (×6): 21 mg via TRANSDERMAL
  Filled 2011-10-09 (×7): qty 1

## 2011-10-09 MED ORDER — ACETAMINOPHEN 325 MG PO TABS
650.0000 mg | ORAL_TABLET | Freq: Four times a day (QID) | ORAL | Status: DC | PRN
  Administered 2011-10-09 – 2011-10-11 (×2): 650 mg via ORAL

## 2011-10-09 MED ORDER — NICOTINE 21 MG/24HR TD PT24
MEDICATED_PATCH | TRANSDERMAL | Status: AC
  Filled 2011-10-09: qty 1

## 2011-10-09 NOTE — Progress Notes (Signed)
Pt admitted voluntary. She was brought to the hospital by her son and his girlfriend. Pt has 3 children and a child that passed away 13 yrs ago at age 49. Pt has si thoughts to OD. She had a previous attempt in May and took a large amount of Goody's powder on 10/06/11. 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 She then discovered that he was seeing someone else. Pt drinks 12 pack of beer daily. She drank two beers before coming to the hospital. Medical hx of two seizures. Last seizure in April. Pt contracts with staff for safety on admission.

## 2011-10-09 NOTE — Telephone Encounter (Signed)
1:58pm 10/09/11 Patient called crying stating that she need to speak to a doctor she is having a really hardtime - asked pt if she was in crisis - pt stated "yes" asked pt if someone was there with her "yes" - advised pt to come to the [facility] to the assessment department - pt then asked "will they keep me" replied to pt " can't answer that it depends on the evaluation.  I spoke with Carollee Herter -provider and Carollee Herter is speaking with the patient./sh

## 2011-10-09 NOTE — BH Assessment (Addendum)
Assessment Note   [name] is an 51 y.o. female. Patient presents to [facility] as a walk-in. Patient brought by several family members. Patient presents to the facility stating "I am depressed and want to die". As Clinical research associate began the [facility] assessment patient was extremely tearful and appeared slightly manic. Her speech was also slightly pressured. She is suicidal and has a plan to OD on pills. She is unable to contract for safety stating, "If I leave here today I will kill myself". Her suicidal thoughts are precipitated by relationship conflict. She reports several depressive symptoms ongoing for several months. Her symptoms consist of  crying spells, loss of appetite, hopelessness, and loss of interest in usual pleasures. Suicidal thoughts have been persistent and worsening since 10/07/11. Multiple prior suicide attempts all by OD and last attempt was 3 days ago. Patient purchased a #50 count box of Goody powder and took approx. #40. No HI and/or AVH's. She denies drug use but admits to alcohol use. When asked how much she drinks patient would not provide a specific amt stating, "I drink 1 beer to 9 million beers but some days I drink nothing at all". She admits to occasional alcohol binges. Last drink was today and patient drank 1-2 beers.   Patient ran by the [facility] and then Dr. Emmit Pomfret. Patient accepted by Dr. Emmit Pomfret. No medical clearance was recommended. Writer informed the [facility] and patient was assigned to Room 500-1.  Axis I: Bipolar, mixed; Alcohol Abuse Axis II: Deferred Axis III:  Past Medical History  Diagnosis Date  . Depression   . Arthritis    Axis IV: housing problems, other psychosocial or environmental problems, problems related to social environment and problems with primary support group and relational conflict Axis V: 31-40 impairment in reality testing  Past Medical History:  Past Medical History  Diagnosis Date  . Depression   . Arthritis     No past surgical history on  file.  Family History: No family history on file.  Social History:  reports that she has been smoking Cigarettes.  She has a 30 pack-year smoking history. She does not have any smokeless tobacco history on file. She reports that she drinks alcohol. She reports that she uses illicit drugs.  Additional Social History:   Age of First Use: 51 yrs old Amount: "I drink 1 beer to 9 million beers"; patient reports occasional drinking binges; sts that some days she drinks only 1 beer. Pt sts, "It's no way to answer that question it's from one extreme to the other" Duration: On-going for several months (approx. 3-4 months) Last Use: Pt reports drinking 1-2 beers earlier today.   No withdrawal symptoms reported.   Patient denies drug use.  CIWA:   COWS:    Allergies:  Allergies  Allergen Reactions  . Sulfa Antibiotics Anaphylaxis    Home Medications:  (Not in a hospital admission)  OB/GYN Status:  No LMP recorded. Patient is not currently having periods (Reason: Perimenopausal).  General Assessment Data Location of Assessment: [facility] Living Arrangements: Other (Comment) (lives with boyfriend on/off) Can pt return to current living arrangement?: Yes Admission Status: Voluntary Is patient capable of signing voluntary admission?: Yes Transfer from: Acute Hospital Referral Source: Self/Family/Friend  Education Status Is patient currently in school?: No  Risk to self Suicidal Ideation: Yes-Currently Present Suicidal Intent: Yes-Currently Present Is patient at risk for suicide?: Yes Suicidal Plan?: Yes-Currently Present Specify Current Suicidal Plan:  (patient has a plan to OD) Access to Means: Yes (  access to purchasing otc medication) Specify Access to Suicidal Means:  (otc medication) What has been your use of drugs/alcohol within the last 12 months?:  (alcohol use daily; some days no alcohol use) Previous Attempts/Gestures: Yes How many times?:  (Multiple; pt did not specify a  particular number) Other Self Harm Risks:  (pt reports overdosing 3 days ago on BC powder; took 400 dose) Triggers for Past Attempts: Other (Comment) (relational conflict) Intentional Self Injurious Behavior: None Family Suicide History: No Recent stressful life event(s): Other (Comment);Conflict (Comment) (sold furniture, moved w/ boyfriend, relational conflict) Persecutory voices/beliefs?: No Depression: Yes Depression Symptoms: Tearfulness;Feeling angry/irritable;Feeling worthless/self pity;Loss of interest in usual pleasures;Guilt;Fatigue;Isolating (pt crying during the assessment (appears manic)) Substance abuse history and/or treatment for substance abuse?: Yes (patient reports participating in CD-IOP; quit program 3/13) Suicide prevention information given to non-admitted patients: Not applicable  Risk to Others Homicidal Ideation: No Thoughts of Harm to Others: No Current Homicidal Intent: No Current Homicidal Plan: No Access to Homicidal Means: No Identified Victim:  (none reported) History of harm to others?: No Assessment of Violence: None Noted Violent Behavior Description:  (patient cooperative, very tearful, manic during assessment) Does patient have access to weapons?: No Criminal Charges Pending?: No Does patient have a court date: No  Psychosis Hallucinations: None noted Delusions: None noted  Mental Status Report Appear/Hygiene: Disheveled Eye Contact: Fair Motor Activity: Agitation Speech: Logical/coherent;Pressured Level of Consciousness: Alert Mood: Depressed;Anxious Affect: Appropriate to circumstance Anxiety Level: None Thought Processes: Coherent Judgement: Impaired Orientation: Person;Place;Time;Situation Obsessive Compulsive Thoughts/Behaviors: None  Cognitive Functioning Concentration: Decreased Memory: Remote Intact;Recent Intact IQ: Average Insight: Poor Impulse Control: Poor Appetite: Poor Weight Loss:  (30 pounds in 2months) Weight  Gain:  (n/a) Sleep: Decreased Total Hours of Sleep:  ("I wake up alot but I think it's the monopause") Vegetative Symptoms: None  ADLScreening [facility] Assessment Services) Patient's cognitive ability adequate to safely complete daily activities?: Yes Patient able to express need for assistance with ADLs?: Yes Independently performs ADLs?: Yes (appropriate for developmental age)  Abuse/Neglect [facility]) Physical Abuse: Denies Verbal Abuse: Denies Sexual Abuse: Denies  Prior Inpatient Therapy Prior Inpatient Therapy: Yes Prior Therapy Dates:  ([facility]-2010;[facility]-several yrs ago) Prior Therapy Facilty/Provider(s):  ([facility] (2x's) and [facility]) Reason for Treatment:  (depression, suicide attempt, alcohol abuse)  Prior Outpatient Therapy Prior Outpatient Therapy: Yes Prior Therapy Dates:  (current-Dr. Lolly Mustache (last seen 3-4 mo's ago); [facility]-IOP 3/13) Prior Therapy Facilty/Provider(s):  ([facility] @ [facility], Joyne Tomar, Dr. Lolly Mustache) Reason for Treatment:  (medication management,substance abuse, and therapy)  ADL Screening (condition at time of admission) Patient's cognitive ability adequate to safely complete daily activities?: Yes Patient able to express need for assistance with ADLs?: Yes Independently performs ADLs?: Yes (appropriate for developmental age) Weakness of Legs: None Weakness of Arms/Hands: None  Home Assistive Devices/Equipment Home Assistive Devices/Equipment: None    Abuse/Neglect Assessment (Assessment to be complete while patient is alone) Physical Abuse: Denies Verbal Abuse: Denies Sexual Abuse: Denies Exploitation of patient/patient's resources: Denies Self-Neglect: Denies Values / Beliefs Cultural Requests During Hospitalization: None Spiritual Requests During Hospitalization: None   Advance Directives (For Healthcare) Advance Directive: Patient does not have advance directive Nutrition Screen- [facility] Adult/[facility] Patient's home diet: Regular  Additional Information 1:1 In Past  12 Months?: No CIRT Risk: No Elopement Risk: No Does patient have medical clearance?: No (Ran patient by Dr. Harvie Heck Reidling; he declines med clearance)     Disposition:  Disposition Disposition of Patient: Other dispositions;Inpatient treatment program (Pt ran by Dr. Emmit Pomfret. Pt  accepted by [facility] 500-1.)  On Site Evaluation by:   Reviewed with Physician:     Melynda Ripple [facility] 10/09/2011 4:25 PM

## 2011-10-10 DIAGNOSIS — F332 Major depressive disorder, recurrent severe without psychotic features: Secondary | ICD-10-CM

## 2011-10-10 DIAGNOSIS — F339 Major depressive disorder, recurrent, unspecified: Secondary | ICD-10-CM | POA: Diagnosis present

## 2011-10-10 DIAGNOSIS — F1020 Alcohol dependence, uncomplicated: Secondary | ICD-10-CM | POA: Diagnosis present

## 2011-10-10 DIAGNOSIS — F411 Generalized anxiety disorder: Secondary | ICD-10-CM | POA: Diagnosis present

## 2011-10-10 LAB — CBC
HCT: 39.1 % (ref 36.0–46.0)
Hemoglobin: 13.5 g/dL (ref 12.0–15.0)
MCHC: 34.5 g/dL (ref 30.0–36.0)
MCV: 88.7 fL (ref 78.0–100.0)
RDW: 13.7 % (ref 11.5–15.5)

## 2011-10-10 LAB — COMPREHENSIVE METABOLIC PANEL
Albumin: 3.6 g/dL (ref 3.5–5.2)
Alkaline Phosphatase: 64 U/L (ref 39–117)
BUN: 14 mg/dL (ref 6–23)
Chloride: 98 mEq/L (ref 96–112)
Creatinine, Ser: 0.7 mg/dL (ref 0.50–1.10)
GFR calc Af Amer: >90 mL/min (ref >90)
Glucose, Bld: 78 mg/dL (ref 70–99)
Potassium: 4 mEq/L (ref 3.5–5.1)
Total Bilirubin: 0.2 mg/dL — ABNORMAL LOW (ref 0.3–1.2)
Total Protein: 7.2 g/dL (ref 6.0–8.3)

## 2011-10-10 MED ORDER — AMITRIPTYLINE HCL 10 MG PO TABS
10.0000 mg | ORAL_TABLET | Freq: Every day | ORAL | Status: DC
  Administered 2011-10-10 – 2011-10-13 (×4): 10 mg via ORAL
  Filled 2011-10-10 (×6): qty 1

## 2011-10-10 MED ORDER — DULOXETINE HCL 60 MG PO CPEP
60.0000 mg | ORAL_CAPSULE | Freq: Every day | ORAL | Status: DC
  Administered 2011-10-11 – 2011-10-14 (×4): 60 mg via ORAL
  Filled 2011-10-10 (×5): qty 1

## 2011-10-10 MED ORDER — GABAPENTIN 300 MG PO CAPS
300.0000 mg | ORAL_CAPSULE | ORAL | Status: DC
  Administered 2011-10-10 – 2011-10-14 (×12): 300 mg via ORAL
  Filled 2011-10-10 (×15): qty 1
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
## 2011-10-10 NOTE — Progress Notes (Signed)
[facility] Group Notes:  (Counselor/Nursing/MHT/Case Management/Adjunct)  10/10/2011 5:57 PM  Type of Therapy:  Group Therapy  Participation Level:  Active  Participation Quality:  Appropriate  Affect:  Appropriate  Cognitive:  Appropriate  Insight:  Limited  Engagement in Group:  Good  Engagement in Therapy:  Good  Modes of Intervention:  Clarification, Education, Socialization and Support  Summary of Progress/Problems: Pt. Participated in counseling group on self sabotaging behaviors and discussed how they can change the way they think. Health coping skills that helped their negative thoughts about themselves were discussed also.  Pt. Spoke about her self sabotaging being that she pushes relationships away. Pt. Refected on changes she needs to make in her life. Pt. Was very emotional and cried when she discussed her issues.  [name] 10/10/2011, 5:57 PM
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
## 2011-10-10 NOTE — Progress Notes (Signed)
Writer observed patient lying in bed awake and tearful. Writer spoke with patient and inquired as to what was wrong and patient reported that she had a terrible headache she rated a 10. Patient received tylenol and writer spoke with patient about what was causing her to cry. Patient reported that she found out her boyfriend had been cheating on her and how she had quit her job to move in with him. Patient was offered support and encouragement to focus on herself and work on getting herself better.  Patient denies si/hi/a/v hall. Safety maintained on unit, will continue to [facility].
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
## 2011-10-10 NOTE — H&P (Signed)
Psychiatric Admission Assessment Adult  Patient Identification:  [name] Date of Evaluation:  10/10/2011 [id number] CC: "Iam depressed and I want to die"  History of Present Illness: Presented as a walk in to [facility] Friday afternoon around 4:30pm. Unfortunately no labs were ordered. Pt could not contract for safety was unusually tearful and had a plan to  OD on pills. Stated " If I leave here I'll kill myself." Has been transferred to IOP downstairs because of unspecified drug use. She was quite distraught that she would not be able to see regular therapist Richrd Sox and stated her most recent suicide attempt was 3 days ago she purchased a 50 count box of Goody powders and took 40 .  Past Psychiatric History: [facility] X [facility] and [facility]  No recent inpatient -followed by Dr.Arfeen and Joannie  Substance Abuse History: Uses alcohol   Social History:    reports that she has been smoking Cigarettes.  She has a 45 pack-year smoking history. She does not have any smokeless tobacco history on file. She reports that she drinks about 6 ounces of alcohol per week. She reports that she does not use illicit drugs. Divorced lives alone but is financially supported by BF. First daughter killed age 3 in MVA would be 54. Has a Daughter 64 son 32 and daughter 58.   Family Psych History: Her mom probably had depression.   Past Medical History:     Past Medical History  Diagnosis Date  . Depression   . Arthritis   . Seizures     2 seizures, last in April      No past surgical history on file.  Allergies:  Allergies  Allergen Reactions  . Seroquel (Quetiapine Fumarate) Anaphylaxis    Pt reports that it made her throat close  . Sulfa Antibiotics Anaphylaxis    Current Medications:  Prior to Admission medications   Not on File    Mental Status Examination/Evaluation: Objective:  Appearance: Casual  Psychomotor Activity:  Increased  Eye Contact::  Good  Speech:  Clear and Coherent and Normal Rate    Volume:  Normal  Mood:  Good   Affect:  Full Range  Thought Process: clear rational goal oriented    Orientation:  Full  Thought Content:  No AVH/psychosis  Suicidal Thoughts:  No have stopped now that she is safe  Homicidal Thoughts:  No  Judgement:  Fair  Insight:  Shallow    DIAGNOSIS:    AXIS I Substance Abuse and Substance Induced Mood Disorder  AXIS II Cluster B Traits  AXIS III See medical history.  AXIS IV economic problems, educational problems, housing problems, occupational problems and problems with primary support group  AXIS V 41-50 serious symptoms     Treatment Plan Summary Admit for safety & stabilization  Meds will be started by Dr. Allena Katz Will get labs and CT of head and abdomen.

## 2011-10-10 NOTE — Progress Notes (Signed)
D Pt is seen OOB UAL on the 500 hall ...tolerated fair. She is quiet...she is getting accustomed to the milieu. She attends her groups and is interactive appropriately. She  Completed her AM self inventory and on it she wrote she cont to have " off and on" SI, but she contracts for safety  ( with this Clinical research associate) .  A She is scheduled for CT's of her abdomen and head tomorrow morning at [facility] and plans are in place for pt to begin drinking contrast medium at 0700, she will be made NPO 4 hrs prior ( beginning at 0500 tomorrow AM), consequently will go without breakfast tomorrow morning and will begin drinking contrast medium at 0700 tomorrow morning.   D Pt states she understands , is in agreement and will comply. POC fostered with therapeutic relationship PD RN [facility]

## 2011-10-10 NOTE — [facility] Suicide Risk Assessment (Signed)
Suicide Risk Assessment  Admission Assessment     Demographic factors:  Assessment Details Time of Assessment: Admission Information Obtained From: Patient Current Mental Status:  Current Mental Status: Suicidal ideation indicated by patient;Suicide plan;Self-harm thoughts Loss Factors:  Loss Factors: Loss of significant relationship;Financial problems / change in socioeconomic status Historical Factors:  Historical Factors: Prior suicide attempts;Family history of mental illness or substance abuse Risk Reduction Factors:  Risk Reduction Factors: Positive therapeutic relationship  CLINICAL FACTORS:   Severe Anxiety and/or Agitation Depression:   Anhedonia Comorbid alcohol abuse/dependence Hopelessness Impulsivity Insomnia Severe Alcohol/Substance Abuse/Dependencies Chronic Pain  COGNITIVE FEATURES THAT CONTRIBUTE TO RISK:  Thought constriction (tunnel vision)    Current Mental Status Per Physician:  Diagnosis:  Axis I: Major Depressive Disorder - Recurrent - Severe.  Generalized Anxiety Disorder.  Alcohol Dependence.  The patient was seen today and reports the following:   ADL's: Intact.  Sleep: The patient reports to having significant difficulty initiating and maintaining sleep. Appetite: The patient reports a decreased appetite with a recent 17 lb weight loss.   Mild>(1-10) >Severe  Hopelessness (1-10): 10  Depression (1-10): 10  Anxiety (1-10): 10   Suicidal Ideation: The patient reports suicidal ideations today but with no plan or intent.  Plan: No  Intent: No  Means: No   Homicidal Ideation: The patient denies any homicidal ideations today.  Plan: No  Intent: No.  Means: No   General Appearance/Behavior: The patient was friendly and cooperative today with this provider but appeared moderately depressed.  Eye Contact: Good.  Speech: Appropriate in rate and volume with no pressuring of speech noted today.  Motor Behavior: wnl.  Level of Consciousness:  Alert and Oriented x 3.  Mental Status: Alert and Oriented x 3.  Mood: Reports severe depression.  Affect: Appears moderately constricted.  Anxiety Level: Severe anxiety reported today.  Thought Process: wnl.  Thought Content: The patient denies any auditory or visual hallucinations or delusional thinking.  Perception: wnl.  Judgment: Fair to Good.  Insight: Fair to Good.  Cognition: Oriented to person, place and time.   Current Medications:     . amitriptyline  10 mg Oral QHS  . DULoxetine  60 mg Oral Q breakfast  . gabapentin  300 mg Oral [id number]  . nicotine  21 mg Transdermal Daily   Review of Systems:  Neurological: No headaches, seizures or dizziness reported.   The patient does report neurological pain from fibromyalgia. G.I.: The patient denies any constipation or stomach upset today.  Musculoskeletal: The patient reports chronic back pain.   Time was spent today discussing with the patient her current symptoms. The patient states that she is having significant difficulty initiating and maintaining sleep and reports a poor appetite today with a recent 17 lb weight loss.  She reports severe feelings of sadness, anhedonia and depressed mood but denies any homicidal ideations. The patient however does report suicidal ideations without plan or intent and reports a suicidal gesture of overdosing on "Goody Powders" prior to admission. The patient denies any auditory or visual hallucinations or delusional thinking. The patient also reports severe anxiety symptoms.   The patient reports a history of alcohol dependence and is being followed by Dr. Lolly Mustache in [facility] outpatient clinic.   Treatment Plan Summary:  1. Daily contact with patient to assess and evaluate symptoms and progress in treatment.  2. Medication management  3. The patient will deny suicidal ideations or homicidal ideations for 48 hours prior to discharge and have a depression  and anxiety rating of 3 or less. The patient  will also deny any auditory or visual hallucinations or delusional thinking.  4. The patient will deny any symptoms of substance withdrawal at time of discharge.   Plan:  1. Will restart the patient on the medication Cymbalta at 60 mgs po q am for depression and pain. 2. Will start the patient on the medication Neurontin at 300 mgs po q am, 2 pm and hs for anxiety, mood stabilization and neuropathic pain. 3. Will restart the patient on the medication Elavil at 10 mgs po qhs for sleep and neuropathic pain. 4. Laboratory Studies reviewed and no recent laboratory studies were ordered. 5. Will order a CMP, CBC with Diff, TSH, Free T3, Free T4, UA, Urine Pregnancy for this evening. 6. Will continue to monitor.   SUICIDE RISK:  Moderate:  Frequent suicidal ideation with limited intensity, and duration, some specificity in terms of plans, no associated intent, good self-control, limited dysphoria/symptomatology, some risk factors present, and identifiable protective factors, including available and accessible social support.  Fardeen Steinberger 10/10/2011, 5:26 PM

## 2011-10-10 NOTE — Progress Notes (Signed)
Psychoeducational Group Note  Date:  10/10/2011 Time:  1515  Group Topic/Focus:  Healthy Communication:   The focus of this group is to discuss communication, barriers to communication, as well as healthy ways to communicate with others.  Participation Level:  Active  Participation Quality:  Appropriate, Sharing and Supportive  Affect:  Appropriate  Cognitive:  Appropriate  Insight:  Good  Engagement in Group:  Good  Additional Comments:  none  Anisten Tomassi M 10/10/2011, 4:17 PM

## 2011-10-10 NOTE — Progress Notes (Signed)
[facility] Group Notes:  (Counselor/Nursing/MHT/Case Management/Adjunct)  10/10/2011 12:04 PM  Type of Therapy:  After Care Planning group  Pt. Attended After care planning group and was given SI pamphlet and crisis holt line numbers and agreed to use them if needed. Pt. Was also given the workbook for today which deals with Healthy Coping skills. Each pt. Was encouraged to look through the book and complete the activities in them. The pt.  Spoke about her depression and anxiety and her issues with alcohol. Pt. Reports being in [facility] and seeing Dr. Lolly Mustache down in IOP. Pt. Also saw Cleophas Dunker, therapist in IOP. Pt. Reports SI on and off but contracted for safety and agreed to let [facility] staff know if she was having thoughts of harming herself or others.   [name] 10/10/2011, 12:04 PM

## 2011-10-10 NOTE — Progress Notes (Signed)
[facility] Family/Significant Other Suicide Prevention Education  Suicide Prevention Education:  Contact Attempts: Wenda Arnold-[phone]-Pt.'s sister- has been identified by the patient as the family member/significant other with whom the patient will be residing, and identified as the person(s) who will aid the patient in the event of a mental health crisis.  With written consent from the patient, two attempts were made to provide suicide prevention education, prior to and/or following the patient's discharge.  We were unsuccessful in providing suicide prevention education.  A suicide education pamphlet was given to the patient to share with family/significant other.  Date and time of first attempt: on 10/10/11 at 5:06pm by Lamar Blinks Date and time of second attempt:  Neila Gear 10/10/2011, 5:06 PM

## 2011-10-11 ENCOUNTER — Inpatient Hospital Stay (HOSPITAL_COMMUNITY)
Admission: RE | Admit: 2011-10-11 | Discharge: 2011-10-11 | Disposition: A | Discharge status: Home / Self Care | Payer: Federal, State, Local not specified - Other | Source: Ambulatory Visit | Attending: Behavioral Health | Admitting: Behavioral Health

## 2011-10-11 DIAGNOSIS — F339 Major depressive disorder, recurrent, unspecified: Secondary | ICD-10-CM

## 2011-10-11 DIAGNOSIS — F1010 Alcohol abuse, uncomplicated: Secondary | ICD-10-CM

## 2011-10-11 DIAGNOSIS — F411 Generalized anxiety disorder: Secondary | ICD-10-CM

## 2011-10-11 LAB — TSH: TSH: 0.955 u[IU]/mL (ref 0.350–4.500)

## 2011-10-11 LAB — URINALYSIS, ROUTINE W REFLEX MICROSCOPIC
Bilirubin Urine: NEGATIVE
Glucose, UA: NEGATIVE mg/dL
Ketones, ur: NEGATIVE mg/dL
Nitrite: NEGATIVE
Specific Gravity, Urine: 1.025 (ref 1.005–1.030)
pH: 6 (ref 5.0–8.0)

## 2011-10-11 LAB — URINE MICROSCOPIC-ADD ON

## 2011-10-11 MED ORDER — IOHEXOL 300 MG/ML  SOLN
100.0000 mL | Freq: Once | INTRAMUSCULAR | Status: AC | PRN
  Administered 2011-10-11: 100 mL via INTRAVENOUS

## 2011-10-11 NOTE — Progress Notes (Signed)
[facility] MD Progress Note  10/11/2011 4:22 PM  S: "I came here Friday afternoon. I was very depressed that led to suicidal thoughts and an attempted suicide by overdose.  I feel better coming to this hospital. It has been an amazing experience so far. My mood is improving, I'm actually having fun today. My appetite is good, sleeping well too. No suicidal thoughts".  Diagnosis:   Axis I: Major depressive disorder, recurrent episode, alcohol abuse, GAD Axis II: Deferred Axis III:  Past Medical History  Diagnosis Date  . Depression   . Arthritis   . Seizures     2 seizures, last in April   Axis IV: other psychosocial or environmental problems Axis V: 41-50 serious symptoms  ADL's:  Intact  Sleep: Good  Appetite:  Good  Suicidal Ideation: "No" Plan:  No Intent:  no Means:  no Homicidal Ideation: "No" Plan:  No Intent:  No Means:  no  AEB (as evidenced by): per patient's reports.  Mental Status Examination/Evaluation: Objective:  Appearance: Casual, petite  Eye Contact::  Good  Speech:  Clear and Coherent  Volume:  Normal  Mood:  Euthymic  Affect:  Appropriate  Thought Process:  Coherent  Orientation:  Full  Thought Content:  Rumination  Suicidal Thoughts:  No  Homicidal Thoughts:  No  Memory:  Immediate;   Good Recent;   Good Remote;   Good  Judgement:  Fair  Insight:  Good  Psychomotor Activity:  Normal  Concentration:  Good  Recall:  Good  Akathisia:  No  Handed:  Right  AIMS (if indicated):     Assets:  Desire for Improvement  Sleep:  Number of Hours: 5.75    Vital Signs:Blood pressure 117/77, pulse 96, temperature 97.5 F (36.4 C), temperature source Oral, resp. rate 16, height 5' 1.5" (1.562 m), weight 50.349 kg (111 lb). Current Medications: Current Facility-Administered Medications  Medication Dose Route Frequency Provider Last Rate Last Dose  . acetaminophen (TYLENOL) tablet 650 mg  650 mg Oral Q6H PRN Mike Craze, MD   650 mg at 10/09/11 2008  .  alum & mag hydroxide-simeth (MAALOX/MYLANTA) 200-200-20 MG/5ML suspension 30 mL  30 mL Oral Q6H PRN Mike Craze, MD      . amitriptyline (ELAVIL) tablet 10 mg  10 mg Oral QHS Curlene Labrum Readling, MD   10 mg at 10/10/11 2138  . DULoxetine (CYMBALTA) DR capsule 60 mg  60 mg Oral Q breakfast Curlene Labrum Readling, MD   60 mg at 10/11/11 0803  . gabapentin (NEURONTIN) capsule 300 mg  300 mg Oral [id number] Curlene Labrum Readling, MD   300 mg at 10/11/11 1554  . magnesium hydroxide (MILK OF MAGNESIA) suspension 30 mL  30 mL Oral Daily PRN Mike Craze, MD      . nicotine (NICODERM CQ - dosed in mg/24 hours) patch 21 mg  21 mg Transdermal Daily Mike Craze, MD   21 mg at 10/11/11 0654   Facility-Administered Medications Ordered in Other Encounters  Medication Dose Route Frequency Provider Last Rate Last Dose  . iohexol (OMNIPAQUE) 300 MG/ML solution 100 mL  100 mL Intravenous Once PRN Medication Radiologist, MD   100 mL at 10/11/11 0946    Lab Results:  Results for orders placed during the hospital encounter of 10/09/11 (from the past 48 hour(s))  CBC     Status: Normal   Collection Time   10/10/11  7:24 PM      Component Value Range Comment  WBC 9.3  4.0 - 10.5 K/uL    RBC 4.41  3.87 - 5.11 MIL/uL    Hemoglobin 13.5  12.0 - 15.0 g/dL    HCT 96.0  45.4 - 09.8 %    MCV 88.7  78.0 - 100.0 fL    MCH 30.6  26.0 - 34.0 pg    MCHC 34.5  30.0 - 36.0 g/dL    RDW 11.9  14.7 - 82.9 %    Platelets 351  150 - 400 K/uL   COMPREHENSIVE METABOLIC PANEL     Status: Abnormal   Collection Time   10/10/11  7:24 PM      Component Value Range Comment   Sodium 138  135 - 145 mEq/L    Potassium 4.0  3.5 - 5.1 mEq/L    Chloride 98  96 - 112 mEq/L    CO2 30  19 - 32 mEq/L    Glucose, Bld 78  70 - 99 mg/dL    BUN 14  6 - 23 mg/dL    Creatinine, Ser 5.62  0.50 - 1.10 mg/dL    Calcium 9.4  8.4 - 13.0 mg/dL    Total Protein 7.2  6.0 - 8.3 g/dL    Albumin 3.6  3.5 - 5.2 g/dL    AST 16  0 - 37 U/L    ALT 8  0 - 35 U/L     Alkaline Phosphatase 64  39 - 117 U/L    Total Bilirubin 0.2 (*) 0.3 - 1.2 mg/dL    GFR calc non Af Amer >90  >90 mL/min    GFR calc Af Amer >90  >90 mL/min   TSH     Status: Normal   Collection Time   10/10/11  7:24 PM      Component Value Range Comment   TSH 0.955  0.350 - 4.500 uIU/mL   T3, FREE     Status: Normal   Collection Time   10/10/11  7:24 PM      Component Value Range Comment   T3, Free 2.9  2.3 - 4.2 pg/mL   T4, FREE     Status: Normal   Collection Time   10/10/11  7:24 PM      Component Value Range Comment   Free T4 0.98  0.80 - 1.80 ng/dL     Physical Findings: AIMS: Facial and Oral Movements Muscles of Facial Expression: None, normal Lips and Perioral Area: None, normal Jaw: None, normal Tongue: None, normal,Extremity Movements Upper (arms, wrists, hands, fingers): None, normal Lower (legs, knees, ankles, toes): None, normal, Trunk Movements Neck, shoulders, hips: None, normal, Overall Severity Severity of abnormal movements (highest score from questions above): None, normal Incapacitation due to abnormal movements: None, normal Patient's awareness of abnormal movements (rate only patient's report): No Awareness, Dental Status Current problems with teeth and/or dentures?: No Does patient usually wear dentures?: No  CIWA:  CIWA-Ar Total: 3  COWS:     Treatment Plan Summary: Daily contact with patient to assess and evaluate symptoms and progress in treatment Medication management  Plan: No changes made on current treatment regimen. Continue current treatment plan.  Armandina Stammer I 10/11/2011, 4:22 PM

## 2011-10-11 NOTE — Progress Notes (Signed)
D: Pt denies SI/HI/AVH. Pt rates her depression as 2, hopelessness as 5, and anxiety as 0. Pt states "The groups this weekend have been great and they have me feeling hopeful and optimistic. I also feel better knowing that there are free support groups in the community for people with bipolar. I never knew that existed." Pt is attributing her hopelessness to personal things that she has to get together once at home. Pt would not go into detail. Pt is requesting written literature on her Neurontin and Elavil. A: Information on Neurontin and Elavil printed from [facility] and given to pt. Support and encouragement offered to pt. Continue Q 15 min checks for pt safety. Scheduled medications given to pt. R: Pt receptive. Pt remains safe on the unit.

## 2011-10-11 NOTE — Progress Notes (Signed)
[facility] Group Notes:  (Counselor/Nursing/MHT/Case Management/Adjunct)  10/11/2011 6:09 PM  Type of Therapy:  Group Therapy  Participation Level:  Active  Participation Quality:  Appropriate, Attentive and Sharing  Affect:  Appropriate  Cognitive:  Appropriate  Insight:  Limited  Engagement in Group:  Good  Engagement in Therapy:  Good  Modes of Intervention:  Clarification, Education, Socialization and Support  Summary of Progress/Problems: Pt. participated in group on healthy supports. Each pt. Identified supports in their lives and group discussed the difference between health and unhealthy supports. The  Group members participated in group activity on what it feels to feel support verses just having someone verbally tell you they will support you. Also each member of the group was encouraged to learn how to support themselves when their supports are not around. Pt. Spoke about emotional support being important to her. Pt. Listed her supports as her sister, daughter and friends. Pt. Spoke abut attending support groups in addition to her therapy as a way of supporting herself when her supports are not around.  Lamar Blinks [place] 10/11/2011, 6:09 PM

## 2011-10-11 NOTE — Progress Notes (Signed)
Patient ID: [name], female   DOB: [birth year], 51 y.o.   MRN: [id number] D)  Has been pleasant and cooperative, out and about on hall, interacting with select peers and staff.  Attended group this evening and participated.  Stayed in dayroom afterward, working on puzzle with peers.  Asked appropriate questions about her medications and was compliant with meds, stated the meds she is getting back on were what had helped before, feeling more positive, feels she has been gaining insight.  A)  Will continue to monitor q 15 minutes for safety, continue POC.  R)  Receptive, appreciative.

## 2011-10-11 NOTE — Progress Notes (Signed)
[facility] Group Notes:  (Counselor/Nursing/MHT/Case Management/Adjunct)  10/11/2011 12:08 AM  Type of Therapy:  Wrap up Group  Participation Level:  Active  Participation Quality:  Appropriate, Attentive and Sharing  Affect:  Appropriate and Excited  Cognitive:  Alert, Appropriate and Oriented  Insight:  Good  Engagement in Group:  Good  Engagement in Therapy:  Good  Modes of Intervention:  Wrap up group/discussion  Summary of Progress/Problems: Pt described her day as really good and was excited about her conversation with the Case manager because she was glad that her depression was being recognized this time instead of just her use of alcohol. She shared that she thinks she wouldn't even drink if she wasn't so depressed. Pt seemed hopeful and encouraged that she was really going to get help this time. Patient still rated her hopelessness a 5 and depression a 5, but said that "yesterday I rated them both 25, so this is a major improvement."  [name] 10/11/2011, 12:08 AM

## 2011-10-11 NOTE — Progress Notes (Signed)
Psychoeducational Group Note  Date:  10/11/2011 Time: 1015 Group Topic/Focus:  Making Healthy Choices:   The focus of this group is to help patients identify negative/unhealthy choices they were using prior to admission and identify positive/healthier coping strategies to replace them upon discharge.  Participation Level:  Active  Participation Quality:  Appropriate  Affect:  Blunted  Cognitive:  Alert  Insight:  Good  Engagement in Group:  Good  Additional Comments:    Rich Brave 1:58 PM. 10/11/2011

## 2011-10-11 NOTE — Progress Notes (Signed)
D Makahla is bathed, dressed and up...seen out in the dayroom..working on a puzzle with other pts at 0730 this morning. She says she has not had anything to eat or drink since 0500 this AM, in preparation for her head and abd CT's ( to be done at 0900 this morning). She is given the 2nd  ( of 2) bottles of barium and tolerates drinking it without problems.   A  Pt completes her AM self inventory and on it she writes she denies SI within the past 24 hrs, she rates her feelings of depression and hopelessness "5/5" and states her DC plan includes finding a job and finding a support group for peoples with bipolar and depressive issues.  R Safety is in place and POC includes continuing to foster therapeutic relationship

## 2011-10-11 NOTE — Progress Notes (Signed)
[facility] Group Notes:  (Counselor/Nursing/MHT/Case Management/Adjunct)  10/11/2011 10:58 AM  Type of Therapy:  After Care Planning Group  Pt. participated in after care planning group and was given [facility] SI pamphlet and crisis hotline numbers. Each pt.  agreed to use them if needed. Patients in the group were also given  Information about the [facility] and a list of free support groups located in [place]. The pt. Was called out by the PA and did not return to group before group ended.  Neila Gear 10/11/2011, 10:58 AM

## 2011-10-11 NOTE — Progress Notes (Signed)
Patient ID: [name], female   DOB: [birth year], 51 y.o.   MRN: [id number] Up and about this am, first bottle of contrast barium suspension given.  Going back to her room to get a shower, has been NPO tonight without problems.  Will continue to monitor.

## 2011-10-12 ENCOUNTER — Other Ambulatory Visit (HOSPITAL_COMMUNITY): Payer: Self-pay | Admitting: Psychiatry

## 2011-10-12 DIAGNOSIS — F3130 Bipolar disorder, current episode depressed, mild or moderate severity, unspecified: Secondary | ICD-10-CM | POA: Diagnosis present

## 2011-10-12 DIAGNOSIS — F319 Bipolar disorder, unspecified: Secondary | ICD-10-CM

## 2011-10-12 MED ORDER — DIVALPROEX SODIUM ER 500 MG PO TB24
500.0000 mg | ORAL_TABLET | Freq: Two times a day (BID) | ORAL | Status: DC
  Administered 2011-10-12 – 2011-10-14 (×5): 500 mg via ORAL
  Filled 2011-10-12 (×8): qty 1

## 2011-10-12 NOTE — Progress Notes (Signed)
Psychoeducational Group Note  Date:  10/12/2011 Time:  2000  Group Topic/Focus:  Wrap-Up Group:   The focus of this group is to help patients review their daily goal of treatment and discuss progress on daily workbooks.  Participation Level:  Active  Participation Quality:  Appropriate  Affect:  Appropriate  Cognitive:  Appropriate  Insight:  Good  Engagement in Group:  Good  Additional Comments:  Patient attended and participated in group. She advised that she had a good day, she attended group, socialized and spoke with family. He support group is her family. She will also like to get outpatient services here upon discharge.  Lita Mains [facility] 10/12/2011, 1:44 AM

## 2011-10-12 NOTE — Progress Notes (Signed)
Psychoeducational Group Note  Date:  10/12/2011 Time:  2000  Group Topic/Focus:  Goals Group:   The focus of this group is to help patients establish daily goals to achieve during treatment and discuss how the patient can incorporate goal setting into their daily lives to aide in recovery.  Participation Level:  Active  Participation Quality:  Appropriate and Sharing  Affect:  Appropriate  Cognitive:  Appropriate  Insight:  Good  Engagement in Group:  Good  Additional Comments:  Patient is "excited about this combination of medications." Patient appears to be motivated for treatment and also mentioned that she just "wants her life back."  Jimmy Plessinger R 10/12/2011, 10:10 PM

## 2011-10-12 NOTE — Progress Notes (Addendum)
Patient's self inventory sheet,  Patient has poor sleep, good appetite, normal energy level, improving attention span.  Rated depression and hopelessness #3.  Denied withdrawals.   Denied SI.   Has had headache in past 24 hours.   Zero pain goal.  Worst pain #1.  After discharge, "will find groups, get a job, start trying to live my life again.  Thank you."  Will not have problems buying meds after discharge. Denied SI and HI.   Denied A/V hallucinations.   Denied pain. Encouraged patient to attend groups.  Patient has been cooperative and pleasant.

## 2011-10-12 NOTE — Progress Notes (Signed)
Psychoeducational Group Note  Date:  10/12/2011 Time:  1100   Group Topic/Focus:  Self Care:   The focus of this group is to help patients understand the importance of self-care in order to improve or restore emotional, physical, spiritual, interpersonal, and financial health.  Participation Level:  Active  Participation Quality:  Attentive, Sharing and Supportive  Affect:  Appropriate  Cognitive:  Appropriate  Insight:  Good  Engagement in Group:  Good  Additional Comments: Patient was very supportive and sharing in group. Patient was very cooperative and intuitive.  Dahlia Client Lyn 10/12/2011, 1:35 PM

## 2011-10-12 NOTE — Progress Notes (Signed)
[facility] MD Progress Note  10/12/2011 11:44 AM  S: "I'm fine, I just did not sleep well last night.  My mind was all over the place, going a hundred and fifty miles per hour.  I had used Seroquel for this in the past, but had seizures when I was on it . I would like something else other than Seroquel to help my mind stay put. Other than this, I am doing fine"  Diagnosis:   Axis I: Bipolar affective disorder, Axis II: Deferred Axis III:  Past Medical History  Diagnosis Date  . Depression   . Arthritis   . Seizures     2 seizures, last in April   Axis IV: other psychosocial or environmental problems Axis V: 41-50 serious symptoms  ADL's:  Intact  Sleep: Fair  Appetite:  Good  Suicidal Ideation: "No' Plan:  No Intent:  No Means:  No Homicidal Ideation: "No" Plan:  No Intent:  no Means:  no  AEB (as evidenced by): Per patient's reports.  Mental Status Examination/Evaluation: Objective:  Appearance: Casual  Eye Contact::  Good  Speech:  Clear and Coherent and Pressured  Volume:  Increased  Mood:  "I'm okay"  Affect:  Appropriate  Thought Process:  Coherent and Intact  Orientation:  Full  Thought Content:  Rumination  Suicidal Thoughts:  No  Homicidal Thoughts:  No  Memory:  Immediate;   Good Recent;   Good Remote;   Good  Judgement:  Fair  Insight:  Good  Psychomotor Activity:  Increased  Concentration:  Fair  Recall:  Good  Akathisia:  No  Handed:  Right  AIMS (if indicated):     Assets:  Desire for Improvement  Sleep:  Number of Hours: 5.75    Vital Signs:Blood pressure 119/82, pulse 80, temperature 98.7 F (37.1 C), temperature source Oral, resp. rate 20, height 5' 1.5" (1.562 m), weight 50.349 kg (111 lb). Current Medications: Current Facility-Administered Medications  Medication Dose Route Frequency Provider Last Rate Last Dose  . acetaminophen (TYLENOL) tablet 650 mg  650 mg Oral Q6H PRN Mike Craze, MD   650 mg at 10/11/11 1949  . alum & mag  hydroxide-simeth (MAALOX/MYLANTA) 200-200-20 MG/5ML suspension 30 mL  30 mL Oral Q6H PRN Mike Craze, MD      . amitriptyline (ELAVIL) tablet 10 mg  10 mg Oral QHS Curlene Labrum Readling, MD   10 mg at 10/11/11 2143  . DULoxetine (CYMBALTA) DR capsule 60 mg  60 mg Oral Q breakfast Curlene Labrum Readling, MD   60 mg at 10/12/11 0814  . gabapentin (NEURONTIN) capsule 300 mg  300 mg Oral [id number] Curlene Labrum Readling, MD   300 mg at 10/12/11 0814  . magnesium hydroxide (MILK OF MAGNESIA) suspension 30 mL  30 mL Oral Daily PRN Mike Craze, MD      . nicotine (NICODERM CQ - dosed in mg/24 hours) patch 21 mg  21 mg Transdermal Daily Mike Craze, MD   21 mg at 10/12/11 0603    Lab Results:  Results for orders placed during the hospital encounter of 10/09/11 (from the past 48 hour(s))  CBC     Status: Normal   Collection Time   10/10/11  7:24 PM      Component Value Range Comment   WBC 9.3  4.0 - 10.5 K/uL    RBC 4.41  3.87 - 5.11 MIL/uL    Hemoglobin 13.5  12.0 - 15.0 g/dL    HCT  39.1  36.0 - 46.0 %    MCV 88.7  78.0 - 100.0 fL    MCH 30.6  26.0 - 34.0 pg    MCHC 34.5  30.0 - 36.0 g/dL    RDW 04.5  40.9 - 81.1 %    Platelets 351  150 - 400 K/uL   COMPREHENSIVE METABOLIC PANEL     Status: Abnormal   Collection Time   10/10/11  7:24 PM      Component Value Range Comment   Sodium 138  135 - 145 mEq/L    Potassium 4.0  3.5 - 5.1 mEq/L    Chloride 98  96 - 112 mEq/L    CO2 30  19 - 32 mEq/L    Glucose, Bld 78  70 - 99 mg/dL    BUN 14  6 - 23 mg/dL    Creatinine, Ser 9.14  0.50 - 1.10 mg/dL    Calcium 9.4  8.4 - 78.2 mg/dL    Total Protein 7.2  6.0 - 8.3 g/dL    Albumin 3.6  3.5 - 5.2 g/dL    AST 16  0 - 37 U/L    ALT 8  0 - 35 U/L    Alkaline Phosphatase 64  39 - 117 U/L    Total Bilirubin 0.2 (*) 0.3 - 1.2 mg/dL    GFR calc non Af Amer >90  >90 mL/min    GFR calc Af Amer >90  >90 mL/min   TSH     Status: Normal   Collection Time   10/10/11  7:24 PM      Component Value Range Comment   TSH  0.955  0.350 - 4.500 uIU/mL   T3, FREE     Status: Normal   Collection Time   10/10/11  7:24 PM      Component Value Range Comment   T3, Free 2.9  2.3 - 4.2 pg/mL   T4, FREE     Status: Normal   Collection Time   10/10/11  7:24 PM      Component Value Range Comment   Free T4 0.98  0.80 - 1.80 ng/dL   PREGNANCY, URINE     Status: Normal   Collection Time   10/11/11  6:16 PM      Component Value Range Comment   Preg Test, Ur NEGATIVE  NEGATIVE   URINALYSIS, ROUTINE W REFLEX MICROSCOPIC     Status: Abnormal   Collection Time   10/11/11  6:16 PM      Component Value Range Comment   Color, Urine YELLOW  YELLOW    APPearance CLOUDY (*) CLEAR    Specific Gravity, Urine 1.025  1.005 - 1.030    pH 6.0  5.0 - 8.0    Glucose, UA NEGATIVE  NEGATIVE mg/dL    Hgb urine dipstick TRACE (*) NEGATIVE    Bilirubin Urine NEGATIVE  NEGATIVE    Ketones, ur NEGATIVE  NEGATIVE mg/dL    Protein, ur NEGATIVE  NEGATIVE mg/dL    Urobilinogen, UA 0.2  0.0 - 1.0 mg/dL    Nitrite NEGATIVE  NEGATIVE    Leukocytes, UA SMALL (*) NEGATIVE   URINE MICROSCOPIC-ADD ON     Status: Abnormal   Collection Time   10/11/11  6:16 PM      Component Value Range Comment   Squamous Epithelial / LPF FEW (*) RARE    WBC, UA 0-2  <3 WBC/hpf    RBC / HPF 0-2  <3 RBC/hpf  Bacteria, UA FEW (*) RARE     Physical Findings: AIMS: Facial and Oral Movements Muscles of Facial Expression: None, normal Lips and Perioral Area: None, normal Jaw: None, normal Tongue: None, normal,Extremity Movements Upper (arms, wrists, hands, fingers): None, normal Lower (legs, knees, ankles, toes): None, normal, Trunk Movements Neck, shoulders, hips: None, normal, Overall Severity Severity of abnormal movements (highest score from questions above): None, normal Incapacitation due to abnormal movements: None, normal Patient's awareness of abnormal movements (rate only patient's report): No Awareness, Dental Status Current problems with teeth and/or  dentures?: No Does patient usually wear dentures?: No  CIWA:  CIWA-Ar Total: 0  COWS:  COWS Total Score: 0   Treatment Plan Summary: Daily contact with patient to assess and evaluate symptoms and progress in treatment Medication management  Plan: Reviewed Abdominal CT scan of 10/11/11. Initiate Depakote ER 500 mg bid for mood stabilizations. Obtain Depakote levels on 10/16/11 Continue current treatment plan.  Armandina Stammer I 10/12/2011, 11:44 AM

## 2011-10-12 NOTE — Progress Notes (Signed)
Patient ID: [name], female   DOB: 1960/11/29, 51 y.o.   MRN: [id number] D: Pt. Denies depression, but Clinical research associate noted some anxiety. "I tried to OD on goody powder"  Pt. Reports "feel really good today." Pt. Reports groups have really been helpful "especially the one this weekend on love and acceptance" "I found out I was I was living my life to pleas other people, but I was empty inside, I had to fulfill myself." Pt.  hx of ETOH abuse says last drink was before admission. "but I was never treated for depression, always for ETOH" A: Pt. Encouraged to attend group. Pt. Will be monitored q69min for safety. R: Pt. Encouraged to keep sharing in group. Pt. Remains safe on the unit. Pt. Attends group.

## 2011-10-13 LAB — TESTOSTERONE: Testosterone: 35.91 ng/dL (ref 10–70)

## 2011-10-13 LAB — TESTOSTERONE, % FREE: Testosterone-% Free: 0.9 % — ABNORMAL HIGH (ref 0.4–2.4)

## 2011-10-13 LAB — TESTOSTERONE, FREE: Testosterone, Free: 3.3 pg/mL (ref 0.6–6.8)

## 2011-10-13 MED ORDER — DULOXETINE HCL 60 MG PO CPEP: 60.0000 mg | ORAL_CAPSULE | Freq: Every day | ORAL | Status: AC

## 2011-10-13 MED ORDER — AMITRIPTYLINE HCL 10 MG PO TABS: 10.0000 mg | ORAL_TABLET | Freq: Every day | ORAL | Status: DC

## 2011-10-13 MED ORDER — DIVALPROEX SODIUM ER 500 MG PO TB24: 500.0000 mg | ORAL_TABLET | Freq: Two times a day (BID) | ORAL | Status: DC

## 2011-10-13 MED ORDER — NICOTINE 21 MG/24HR TD PT24: 1.0000 | MEDICATED_PATCH | Freq: Every day | TRANSDERMAL | Status: AC

## 2011-10-13 MED ORDER — GABAPENTIN 300 MG PO CAPS: 300.0000 mg | ORAL_CAPSULE | ORAL | Status: DC

## 2011-10-13 NOTE — Progress Notes (Signed)
Pt attended discharge planning group and actively participated in group.  SW provided pt with today's workbook.  Pt presents with calm mood and affect.  Pt states that she came into the hospital due to depression.  Pt states that she was stressed out and dealing with personal issues.  Pt states that she lives in [place] alone and has transportation home.  Pt states that she was seeing Dr. Lolly Mustache and Cleophas Dunker in [facility].  Pt states that they felt she had more of an alcohol problem and referred her to [facility].  Pt states that she quit but wouldn't mind going back to them.  SW contacted outpatient to see if pt will be able to return to them.  Pt denies having depression, anxiety and SI today.  No further needs voiced by pt at this time.  Safety planning and suicide prevention discussed.  Pt participated in discussion and acknowledged an understanding of the information provided.       Reyes Ivan, LCSWA 10/13/2011  9:42 AM
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
## 2011-10-13 NOTE — Tx Team (Addendum)
Interdisciplinary Treatment Plan Update (Adult)  Date:  10/13/2011  Time Reviewed:  9:33 AM   Progress in Treatment: Attending groups: Yes Participating in groups:  Yes Taking medication as prescribed: Yes Tolerating medication:  Yes Family/Significant other contact made:  Counselor assessing for appropriate contact Patient understands diagnosis:  Yes Discussing patient identified problems/goals with staff:  Yes Medical problems stabilized or resolved:  Yes Denies suicidal/homicidal ideation: Yes Issues/concerns per patient self-inventory:  None identified Other: N/A  New problem(s) identified: None Identified  Reason for Continuation of Hospitalization: Anxiety Depression Medication stabilization  Interventions implemented related to continuation of hospitalization: mood stabilization, medication monitoring and adjustment, group therapy and psycho education, safety checks q 15 mins  Additional comments: N/A  Estimated length of stay: 3-5 days  Discharge Plan: SW is assessing for appropriate referrals.    New goal(s): N/A  Review of initial/current patient goals per problem list:    1.  Goal(s): Reduce depressive symptoms  Met:  No  Target date: by discharge  As evidenced by: Reducing depression from a 10 to a 3 as reported by pt.   2.  Goal (s): Reduce/Eliminate suicidal ideation  Met:  No  Target date: by discharge  As evidenced by: pt reporting no SI.    3.  Goal(s): Reduce anxiety symptoms  Met:  No  Target date: by discharge  As evidenced by: Reduce anxiety from a 10 to a 3 as reported by pt.    Attendees: Patient:    Family:     Physician:  Orson Aloe, MD  10/13/2011  9:33 AM   Nursing:   Leighton Parody, RN 10/13/2011 9:33 AM   Case Manager:  Reyes Ivan, LCSWA 10/13/2011  9:33 AM   Counselor:  Angus Palms, LCSW 10/13/2011  9:33 AM   Other:  Juline Patch, LCSW 10/13/2011  9:33 AM   Other:  Izola Price, RN 10/13/2011 9:35 AM   Other:       Other:      Scribe for Treatment Team:   Carmina Miller, 10/13/2011 , 9:33 AM

## 2011-10-13 NOTE — Progress Notes (Signed)
[facility] MD Progress Note  10/13/2011 4:46 PM  Diagnosis:   Axis I: Alcohol Abuse, Bipolar, Depressed and Generalized Anxiety Disorder Axis II: Deferred Axis III:  Past Medical History  Diagnosis Date  . Depression   . Arthritis   . Seizures     2 seizures, last in April   Axis IV: other psychosocial or environmental problems Axis V: 61-70 mild symptoms  ADL's:  Intact  Sleep: Good  Appetite:  Good  Suicidal Ideation:  Pt denies any thoughts, plans, intent of suicide Homicidal Ideation:  Pt denies any thoughts, plans, intent of homicide  AEB (as evidenced by): per pt report  Mental Status Examination/Evaluation: Objective:  Appearance: Casual  Eye Contact::  Good  Speech:  Pressured  Volume:  Normal  Mood:  Euphoric  Affect:  Congruent  Thought Process:  Coherent  Orientation:  Full  Thought Content:  WDL  Suicidal Thoughts:  No  Homicidal Thoughts:  No  Memory:  Immediate;   Good Recent;   Good Remote;   Good  Judgement:  Fair  Insight:  Fair  Psychomotor Activity:  Normal  Concentration:  Good  Recall:  Good  Akathisia:  No  Handed:  Right  AIMS (if indicated):     Assets:  Communication Skills Desire for Improvement  Sleep:  Number of Hours: 6.5    Vital Signs:Blood pressure 105/72, pulse 99, temperature 97.9 F (36.6 C), temperature source Oral, resp. rate 20, height 5' 1.5" (1.562 m), weight 50.349 kg (111 lb). Current Medications: Current Facility-Administered Medications  Medication Dose Route Frequency Provider Last Rate Last Dose  . acetaminophen (TYLENOL) tablet 650 mg  650 mg Oral Q6H PRN Mike Craze, MD   650 mg at 10/11/11 1949  . alum & mag hydroxide-simeth (MAALOX/MYLANTA) 200-200-20 MG/5ML suspension 30 mL  30 mL Oral Q6H PRN Mike Craze, MD      . amitriptyline (ELAVIL) tablet 10 mg  10 mg Oral QHS Curlene Labrum Readling, MD   10 mg at 10/12/11 2159  . divalproex (DEPAKOTE ER) 24 hr tablet 500 mg  500 mg Oral BID Sanjuana Kava, NP   500 mg  at 10/13/11 0813  . DULoxetine (CYMBALTA) DR capsule 60 mg  60 mg Oral Q breakfast Curlene Labrum Readling, MD   60 mg at 10/13/11 0813  . gabapentin (NEURONTIN) capsule 300 mg  300 mg Oral [id number] Curlene Labrum Readling, MD   300 mg at 10/13/11 1449  . magnesium hydroxide (MILK OF MAGNESIA) suspension 30 mL  30 mL Oral Daily PRN Mike Craze, MD      . nicotine (NICODERM CQ - dosed in mg/24 hours) patch 21 mg  21 mg Transdermal Daily Mike Craze, MD   21 mg at 10/13/11 1191    Lab Results:  Results for orders placed during the hospital encounter of 10/09/11 (from the past 48 hour(s))  PREGNANCY, URINE     Status: Normal   Collection Time   10/11/11  6:16 PM      Component Value Range Comment   Preg Test, Ur NEGATIVE  NEGATIVE   URINALYSIS, ROUTINE W REFLEX MICROSCOPIC     Status: Abnormal   Collection Time   10/11/11  6:16 PM      Component Value Range Comment   Color, Urine YELLOW  YELLOW    APPearance CLOUDY (*) CLEAR    Specific Gravity, Urine 1.025  1.005 - 1.030    pH 6.0  5.0 - 8.0  Glucose, UA NEGATIVE  NEGATIVE mg/dL    Hgb urine dipstick TRACE (*) NEGATIVE    Bilirubin Urine NEGATIVE  NEGATIVE    Ketones, ur NEGATIVE  NEGATIVE mg/dL    Protein, ur NEGATIVE  NEGATIVE mg/dL    Urobilinogen, UA 0.2  0.0 - 1.0 mg/dL    Nitrite NEGATIVE  NEGATIVE    Leukocytes, UA SMALL (*) NEGATIVE   URINE MICROSCOPIC-ADD ON     Status: Abnormal   Collection Time   10/11/11  6:16 PM      Component Value Range Comment   Squamous Epithelial / LPF FEW (*) RARE    WBC, UA 0-2  <3 WBC/hpf    RBC / HPF 0-2  <3 RBC/hpf    Bacteria, UA FEW (*) RARE     Physical Findings: AIMS: Facial and Oral Movements Muscles of Facial Expression: None, normal Lips and Perioral Area: None, normal Jaw: None, normal Tongue: None, normal,Extremity Movements Upper (arms, wrists, hands, fingers): None, normal Lower (legs, knees, ankles, toes): None, normal, Trunk Movements Neck, shoulders, hips: None, normal, Overall  Severity Severity of abnormal movements (highest score from questions above): None, normal Incapacitation due to abnormal movements: None, normal Patient's awareness of abnormal movements (rate only patient's report): No Awareness, Dental Status Current problems with teeth and/or dentures?: No Does patient usually wear dentures?: No  CIWA:  CIWA-Ar Total: 0  COWS:  COWS Total Score: 0   Treatment Plan Summary: Daily contact with patient to assess and evaluate symptoms and progress in treatment Medication management Mood/anxiety less than 3/10 where the scale is 1 is the best and 10 is the worst  Plan: Get Depakote level in AM.  Refer to 12 step meetings. Consider D/C tomorrow with a list of AA meetings.  [name] 10/13/2011, 4:46 PM

## 2011-10-13 NOTE — Progress Notes (Signed)
Psychoeducational Group Note  Date:  10/13/2011 Time:  1100  Group Topic/Focus:  Recovery Goals:   The focus of this group is to identify appropriate goals for recovery and establish a plan to achieve them.  Participation Level: Did Not Attend  Participation Quality:  Not Applicable  Affect:  Not Applicable  Cognitive:  Not Applicable  Insight:  Not Applicable  Engagement in Group: Not Applicable  Additional Comments:  Pt did not attend group.   Sharyn Lull 10/13/2011, 11:52 AM

## 2011-10-13 NOTE — Progress Notes (Signed)
D:  Patient up and did attend the first group of the day, but did not attend any after that.  She has been interacting well with staff and peers.  She is tolerating her medications well.  She denies depressive or hopeless feelings and denies thoughts of self harm.   A:  Medications given as ordered.  Encouraged group participation.  R:  Pleasant and cooperative.  Interacting well with staff and peers.

## 2011-10-13 NOTE — Progress Notes (Signed)
Psychoeducational Group Note  Date:  10/13/2011 Time:  1000  Group Topic/Focus:  Therapeutic activity   Participation Level:  Did Not Attend  Participation Quality:  Did not attend  Affect:  Appropriate  Cognitive:  Appropriate  Insight:  Did not attend  Engagement in Group:  Did not attend  Additional Comments:  Did not attend   Meredith Staggers 10/13/2011, 12:21 PM

## 2011-10-13 NOTE — Progress Notes (Signed)
D: Patient in day room working on a puzzle on approach.  Patient interacting with peers and visible in the milieu.  Patient rates depression 0/10 tonight.  Patient states she feels like she is getting better.  Patient states [facility] is excellent and she feels like she has learned a lot.  Patient states she wants to get a job an stay away from a her ex boyfriend.  PAtient denies SI/HI and denies AVH at this time. A: Support and encouragement offered.  Scheduled medications administered per orders.  Staff to monitor Q 15 mins for safety.   R: Patient remains safe on the unit. Taking administered medications.  Patient calm and cooperative and working on a puzzle in the day room.

## 2011-10-14 DIAGNOSIS — F429 Obsessive-compulsive disorder, unspecified: Secondary | ICD-10-CM

## 2011-10-14 DIAGNOSIS — F3130 Bipolar disorder, current episode depressed, mild or moderate severity, unspecified: Principal | ICD-10-CM

## 2011-10-14 DIAGNOSIS — F605 Obsessive-compulsive personality disorder: Secondary | ICD-10-CM | POA: Diagnosis present

## 2011-10-14 LAB — DRUGS OF ABUSE SCREEN W/O ALC, ROUTINE URINE
Amphetamine Screen, Ur: NEGATIVE
Creatinine,U: 36.5 mg/dL
Marijuana Metabolite: NEGATIVE
Opiate Screen, Urine: NEGATIVE
Propoxyphene: NEGATIVE

## 2011-10-14 NOTE — Progress Notes (Signed)
Patient denies SI/HI, denies A/V hallucinations. Patient verbalizes understanding of discharge instructions, follow up care and prescriptions. Patient given all belongings from [facility] locker. Patient escorted out by staff, transported by family. 

## 2011-10-14 NOTE — Discharge Summary (Signed)
Physician Discharge Summary Note  Patient:  [name] is an 51 y.o., female MRN:  [id number] DOB:  [birth year] Patient phone:  [phone] (home)  Patient address:   [address] [place] [address]   Date of Admission:  10/09/2011 Date of Discharge: 10/14/2011  Discharge Diagnoses: Principal Problem:  *Bipolar affective disorder, depressed Active Problems:  Major depressive disorder, recurrent episode  Generalized anxiety disorder  Alcohol dependence  Axis Diagnosis:  Axis I: Alcohol Abuse, Bipolar, Depressed, Generalized Anxiety Disorder, Compulsive Behavior Disorder (codependency) Axis II: Deferred  Axis III:  Past Medical History   Diagnosis  Date   .  Depression    .  Arthritis    .  Seizures      2 seizures, last in April    Axis IV: other psychosocial or environmental problems  Axis V: 61-70 mild symptoms   Level of Care:  OP  Hospital Course:   Presented as a walk in to [facility] Friday afternoon around 4:30pm. Unfortunately no labs were ordered. Pt could not contract for safety was unusually tearful and had a plan to OD on pills. Stated " If I leave here I'll kill myself." Has been transferred to IOP downstairs because of unspecified drug use. She was quite distraught that she would not be able to see regular therapist Richrd Sox and stated her most recent suicide attempt was 3 days ago she purchased a 50 count box of Goody powders and took 40 .  While a patient in this hospital, Ms. Carbo received medication management for depression, mood control, anxiety, insomnia, nicotine cessation, and pain control. They were ordered and received Elavil, Depakote, Cymbalta, Neurontin, and Nicoderm for these conditions. They were also enrolled in group counseling sessions and activities in which they participated actively.   Patient attended treatment team meeting this am and met with treatment team members. Pt symptoms, treatment plan and response to treatment discussed. Ms. Romain  endorsed that their symptoms have improved. Pt also stated that they are stable for discharge.  They reported that from this hospital stay they had learned that they need to put themselves first.  In other to maintain their mood, anxiety, and sobriety, they will continue psychiatric care on outpatient basis. They will follow-up at [facility] walk in clinic on 9/6 at 0800.  In addition they were instructed to attend 90 meetings in 90 days, to consider attending [facility], to take all your medications as prescribed by your mental healthcare provider, to report any adverse effects and or reactions from your medicines to your outpatient provider promptly, patient is instructed and cautioned to not engage in alcohol and or illegal drug use while on prescription medicines, in the event of worsening symptoms, patient is instructed to call the crisis hotline, 911 and or go to the nearest ED for appropriate evaluation and treatment of symptoms.   Upon discharge, patient adamantly denies suicidal, homicidal ideations, auditory, visual hallucinations and or delusional thinking. They left [facility] with all personal belongings via personal transportation in no apparent distress.  Consults:  None  Significant Diagnostic Studies:  labs: Depakote level 54.9, CMET, CBC, UA non contributory  Discharge Vitals:   Blood pressure 108/59, pulse 86, temperature 97.9 F (36.6 C), temperature source Oral, resp. rate 16, height 5' 1.5" (1.562 m), weight 50.349 kg (111 lb)..  Mental Status Exam: See Mental Status Examination and Suicide Risk Assessment completed by Attending Physician prior to discharge.  Discharge destination:  Home  Is patient on multiple antipsychotic therapies at  discharge:  No  Has Patient had three or more failed trials of antipsychotic monotherapy by history: N/A Recommended Plan for Multiple Antipsychotic Therapies: N/A  Medication List  As of 10/14/2011  9:31 AM   TAKE these medications       Indication    amitriptyline 10 MG tablet   Commonly known as: ELAVIL   Take 1 tablet (10 mg total) by mouth at bedtime. For pain management, depression, and insomnia.       divalproex 500 MG 24 hr tablet   Commonly known as: DEPAKOTE ER   Take 1 tablet (500 mg total) by mouth 2 (two) times daily. For mood control.       DULoxetine 60 MG capsule   Commonly known as: CYMBALTA   Take 1 capsule (60 mg total) by mouth daily with breakfast. For depression.       gabapentin 300 MG capsule   Commonly known as: NEURONTIN   Take 1 capsule (300 mg total) by mouth 3 (three) times daily at 8am, 2pm and bedtime. For anxiety and pain       nicotine 21 mg/24hr patch   Commonly known as: NICODERM CQ - dosed in mg/24 hours   Place 1 patch onto the skin daily. For smoking cessation.            Follow-up Information    Follow up with [facility] on 10/21/2011. (Appointment scheduled at 2:00 pm)    Contact information:   [address] [place], [place] [address] [phone]        Follow-up recommendations:   Activities: Resume typical activities Diet: Resume typical diet Tests: follow up on Depakote level Other: Follow up with outpatient provider and report any side effects to out patient prescriber.  Comments:  Take all your medications as prescribed by your mental healthcare provider. Report any adverse effects and or reactions from your medicines to your outpatient provider promptly. Patient is instructed and cautioned to not engage in alcohol and or illegal drug use while on prescription medicines. In the event of worsening symptoms, patient is instructed to call the crisis hotline, 911 and or go to the nearest ED for appropriate evaluation and treatment of symptoms.  SignedOrson Aloe 10/14/2011 9:31 AM

## 2011-10-14 NOTE — Tx Team (Signed)
Interdisciplinary Treatment Plan Update (Adult)  Date:  10/14/2011  Time Reviewed:  10:23 AM   Progress in Treatment: Attending groups: Yes Participating in groups:  Yes Taking medication as prescribed: Yes Tolerating medication:  Yes Family/Significant other contact made: Yes Patient understands diagnosis:  Yes Discussing patient identified problems/goals with staff:  Yes Medical problems stabilized or resolved:  Yes Denies suicidal/homicidal ideation: Yes Issues/concerns per patient self-inventory:  None identified Other: N/A  New problem(s) identified: None Identified  Reason for Continuation of Hospitalization: Stable to d/c  Interventions implemented related to continuation of hospitalization: Stable to d/c  Additional comments: N/A  Estimated length of stay: D/C today  Discharge Plan: Pt will follow up with [facility] for medication management and therapy.  Also referred pt to Constance Goltz and AA meetings in the community.    New goal(s): N/A  Review of initial/current patient goals per problem list:    1.  Goal(s): Reduce depressive symptoms  Met:  Yes  Target date: by discharge  As evidenced by: Reducing depression from a 10 to a 3 as reported by pt.  Pt rates at a 0 today.   2.  Goal (s): Reduce/Eliminate suicidal ideation  Met:  Yes  Target date: by discharge  As evidenced by: pt reporting no SI.    3.  Goal(s): Reduce anxiety symptoms  Met:  Yes  Target date: by discharge  As evidenced by: Reduce anxiety from a 10 to a 3 as reported by pt. Pt rates at a 0 today.     Attendees: Patient:  [name] 10/14/2011 10:23 AM   Family:     Physician:  Orson Aloe, MD 10/14/2011 10:23 AM   Nursing:   Leighton Parody, RN 10/14/2011 10:23 AM   Case Manager:  Reyes Ivan, LCSWA 10/14/2011 10:23 AM   Counselor:  Angus Palms, LCSW 10/14/2011 10:23 AM   Other:  Juline Patch, LCSW 10/14/2011 10:23 AM   Other:    Other:     Other:      Scribe for Treatment Team:     Carmina Miller, 10/14/2011 10:23 AM

## 2011-10-14 NOTE — Progress Notes (Signed)
[facility] Case Management Discharge Plan:  Will you be returning to the same living situation after discharge: Yes,  returning home At discharge, do you have transportation home?:Yes,  access to transportation Do you have the ability to pay for your medications:Yes,  access to meds  Interagency Information:     Release of information consent forms completed and in the chart;  Patient's signature needed at discharge.  Patient to Follow up at:  Follow-up Information    Follow up with [facility] on 10/16/2011. (Appointment scheduled at 8:00 am with Dr. Tyrell Antonio)    [address]:   [address], [place] [address] [phone]         Patient denies SI/HI:   Yes,  denies SI/HI today    Safety Planning and Suicide Prevention discussed:  Yes,  discussed with pt today  Barrier to discharge identified:No.  Summary and Recommendations: Pt attended discharge planning group and actively participated in group.  SW provided pt with today's workbook.  Pt presents with calm mood and affect.  Pt denies having depression, anxiety and SI/HI.  Pt reports feeling stable to d/c today.  No recommendations from SW.  No further needs voiced by pt.  Pt stable to discharge.     Carmina Miller 10/14/2011, 11:05 AM
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
## 2011-10-15 LAB — ESTROGENS, TOTAL: Estrogen: 70.8 pg/mL

## 2011-10-16 NOTE — Progress Notes (Signed)
Patient Discharge Instructions:  After Visit Summary (AVS):   Faxed to:  10/16/2011 Psychiatric Admission Assessment Note:   Faxed to:  10/16/2011 Suicide Risk Assessment - Discharge Assessment:   Faxed to:  10/16/2011 Faxed/Sent to the Next Level Care provider:  10/16/2011  Faxed to: [facility] @ [phone]  Karleen Hampshire Brittini, 10/16/2011, 10:36 AM

## 2011-10-20 ENCOUNTER — Other Ambulatory Visit (HOSPITAL_COMMUNITY): Payer: Self-pay | Admitting: Psychiatry

## 2011-10-21 ENCOUNTER — Ambulatory Visit (HOSPITAL_COMMUNITY): Payer: Self-pay | Admitting: Psychiatry

## 2013-02-14 ENCOUNTER — Encounter (HOSPITAL_COMMUNITY): Payer: Self-pay | Admitting: Emergency Medicine

## 2013-02-14 ENCOUNTER — Emergency Department (HOSPITAL_COMMUNITY)
Admission: EM | Admit: 2013-02-14 | Discharge: 2013-02-14 | Disposition: A | Discharge status: Home / Self Care | Payer: Self-pay | Attending: Emergency Medicine | Admitting: Emergency Medicine

## 2013-02-14 ENCOUNTER — Emergency Department (HOSPITAL_COMMUNITY): Payer: Self-pay

## 2013-02-14 DIAGNOSIS — F17200 Nicotine dependence, unspecified, uncomplicated: Secondary | ICD-10-CM | POA: Insufficient documentation

## 2013-02-14 DIAGNOSIS — Z8739 Personal history of other diseases of the musculoskeletal system and connective tissue: Secondary | ICD-10-CM | POA: Insufficient documentation

## 2013-02-14 DIAGNOSIS — J029 Acute pharyngitis, unspecified: Secondary | ICD-10-CM | POA: Insufficient documentation

## 2013-02-14 DIAGNOSIS — IMO0001 Reserved for inherently not codable concepts without codable children: Secondary | ICD-10-CM | POA: Insufficient documentation

## 2013-02-14 DIAGNOSIS — J40 Bronchitis, not specified as acute or chronic: Secondary | ICD-10-CM

## 2013-02-14 DIAGNOSIS — Z8659 Personal history of other mental and behavioral disorders: Secondary | ICD-10-CM | POA: Insufficient documentation

## 2013-02-14 DIAGNOSIS — R52 Pain, unspecified: Secondary | ICD-10-CM | POA: Insufficient documentation

## 2013-02-14 DIAGNOSIS — J209 Acute bronchitis, unspecified: Secondary | ICD-10-CM | POA: Insufficient documentation

## 2013-02-14 DIAGNOSIS — Z8669 Personal history of other diseases of the nervous system and sense organs: Secondary | ICD-10-CM | POA: Insufficient documentation

## 2013-02-14 LAB — RAPID STREP SCREEN (MED CTR MEBANE ONLY): Streptococcus, Group A Screen (Direct): NEGATIVE

## 2013-02-14 MED ORDER — AZITHROMYCIN 250 MG PO TABS: ORAL_TABLET | ORAL | Status: DC

## 2013-02-14 MED ORDER — GUAIFENESIN 100 MG/5ML PO LIQD: 100.0000 mg | ORAL | Status: DC | PRN

## 2013-02-14 MED ORDER — ALBUTEROL SULFATE HFA 108 (90 BASE) MCG/ACT IN AERS
2.0000 | INHALATION_SPRAY | Freq: Once | RESPIRATORY_TRACT | Status: AC
  Administered 2013-02-14: 2 via RESPIRATORY_TRACT
  Filled 2013-02-14: qty 6.7

## 2013-02-14 MED ORDER — SODIUM CHLORIDE 0.9 % IV BOLUS (SEPSIS)
1000.0000 mL | Freq: Once | INTRAVENOUS | Status: AC
  Administered 2013-02-14: 1000 mL via INTRAVENOUS

## 2013-02-14 NOTE — ED Provider Notes (Signed)
CSN: [id number]     Arrival date & time 02/14/13  2029 History   First MD Initiated Contact with Patient 02/14/13 2112     Chief Complaint  Patient presents with  . Chest Pain  . Generalized Body Aches   (Consider location/radiation/quality/duration/timing/severity/associated sxs/prior Treatment) Patient is a 53 y.o. female presenting with cough. The history is provided by the patient. No language interpreter was used.  Cough Cough characteristics:  Productive Sputum characteristics:  Yellow Severity:  Moderate Duration:  5 weeks Timing:  Constant Progression:  Worsening Chronicity:  New Relieved by:  Nothing Worsened by:  Nothing tried Ineffective treatments:  None tried Associated symptoms: chest pain, chills, fever, headaches, myalgias, rhinorrhea, shortness of breath and sore throat   Associated symptoms: no diaphoresis and no eye discharge   Chest pain:    Quality:  Aching   Severity:  Moderate   Duration:  5 weeks   Timing:  Constant   Progression:  Waxing and waning   Chronicity:  New Fever:    Timing:  Intermittent   Temp source:  Subjective   Progression:  Improving Shortness of breath:    Severity:  Moderate   Onset quality:  Gradual   Duration:  5 weeks   Timing:  Intermittent   Progression:  Waxing and waning Sore throat:    Severity:  Moderate   Onset quality:  Gradual   Duration:  3 days   Timing:  Constant   Progression:  Worsening   Past Medical History  Diagnosis Date  . Depression   . Arthritis   . Seizures     2 seizures, last in April   Past Surgical History  Procedure Laterality Date  . C section     History reviewed. No pertinent family history. History  Substance Use Topics  . Smoking status: Current Every Day Smoker -- 1.50 packs/day for 30 years    Types: Cigarettes  . Smokeless tobacco: Not on file  . Alcohol Use: 6.0 oz/week    12 drink(s) per week     Comment: Occassional drinker   OB History   Grav Para Term Preterm  Abortions TAB SAB Ect Mult Living                 Review of Systems  Constitutional: Positive for fever and chills. Negative for diaphoresis, activity change, appetite change and fatigue.  HENT: Positive for rhinorrhea and sore throat. Negative for congestion and facial swelling.   Eyes: Negative for photophobia and discharge.  Respiratory: Positive for cough and shortness of breath. Negative for chest tightness.   Cardiovascular: Positive for chest pain. Negative for palpitations and leg swelling.  Gastrointestinal: Negative for nausea, vomiting, abdominal pain and diarrhea.  Endocrine: Negative for polydipsia and polyuria.  Genitourinary: Negative for dysuria, frequency, difficulty urinating and pelvic pain.  Musculoskeletal: Positive for myalgias. Negative for arthralgias, back pain, neck pain and neck stiffness.  Skin: Negative for color change and wound.  Allergic/Immunologic: Negative for immunocompromised state.  Neurological: Positive for headaches. Negative for facial asymmetry, weakness and numbness.  Hematological: Does not bruise/bleed easily.  Psychiatric/Behavioral: Negative for confusion and agitation.    Allergies  Seroquel and Sulfa antibiotics  Home Medications   Current Outpatient Rx  Name  Route  Sig  Dispense  Refill  . DM-Doxylamine-Acetaminophen (NYQUIL COLD & FLU PO)   Oral   Take 1 capsule by mouth at bedtime as needed (flu-like symptoms).         Marland Kitchen guaifenesin (ROBITUSSIN)  100 MG/5ML syrup   Oral   Take 200 mg by mouth 3 (three) times daily as needed for cough (COUGH).         . pseudoephedrine (SUDAFED 12 HOUR) 120 MG 12 hr tablet   Oral   Take 120 mg by mouth every 12 (twelve) hours as needed for congestion (CONGESTION).         . Pseudoephedrine-APAP-DM (DAYQUIL MULTI-SYMPTOM COLD/FLU PO)   Oral   Take 1 capsule by mouth 2 (two) times daily as needed (FLU-LIKE SYMPTOMS).         Marland Kitchen azithromycin (ZITHROMAX Z-PAK) 250 MG tablet      2 po  day one, then 1 daily x 4 days   5 tablet   0   . guaiFENesin (ROBITUSSIN) 100 MG/5ML liquid   Oral   Take 5-10 mLs (100-200 mg total) by mouth every 4 (four) hours as needed for cough.   60 mL   0    BP 94/55  Pulse 90  Temp(Src) 97.9 F (36.6 C) (Oral)  Resp 18  Ht 5\' 1"  (1.549 m)  Wt 110 lb (49.896 kg)  BMI 20.80 kg/m2  SpO2 100% Physical Exam  Constitutional: She is oriented to person, place, and time. She appears well-developed and well-nourished. No distress.  HENT:  Head: Normocephalic and atraumatic.  Mouth/Throat: No oropharyngeal exudate.  Eyes: Pupils are equal, round, and reactive to light.  Neck: Normal range of motion. Neck supple.  Cardiovascular: Normal rate, regular rhythm and normal heart sounds.  Exam reveals no gallop and no friction rub.   No murmur heard. Pulmonary/Chest: Effort normal. No respiratory distress. She has wheezes in the right upper field, the right middle field, the left upper field and the left middle field. She has rhonchi in the right lower field, the left upper field, the left middle field and the left lower field. She has no rales.  Abdominal: Soft. Bowel sounds are normal. She exhibits no distension and no mass. There is no tenderness. There is no rebound and no guarding.  Musculoskeletal: Normal range of motion. She exhibits no edema and no tenderness.  Neurological: She is alert and oriented to person, place, and time.  Skin: Skin is warm and dry.  Psychiatric: She has a normal mood and affect.    ED Course  Procedures (including critical care time) Labs Review Labs Reviewed  RAPID STREP SCREEN  CULTURE, GROUP A STREP   Imaging Review Dg Chest 2 View  02/14/2013   CLINICAL DATA:  Chest pain  EXAM: CHEST  2 VIEW  COMPARISON:  PA and lateral chest of December 01, 2008.  FINDINGS: The lungs are hyperinflated. The interstitial markings are mildly prominent but this is not entirely new. There is prominence of the nipple shadows on the  current study. There is no pleural effusion or alveolar infiltrate. The cardiac silhouette is normal in size. The pulmonary vascularity is not engorged.  IMPRESSION: The findings are consistent with COPD with mildly increased interstitial markings likely reflecting the patient's smoking history. I cannot exclude acute bronchitis in the appropriate clinical setting. There is no evidence of CHF. No parenchymal masses or pleural effusion or pneumothorax is demonstrated.   Electronically Signed   By: David  [place]   On: 02/14/2013 21:49    EKG Interpretation    Date/Time:  Tuesday February 14 2013 20:42:15 EST Ventricular Rate:  91 PR Interval:  153 QRS Duration: 95 QT Interval:  382 QTC Calculation: 470 R Axis:   -  23 Text Interpretation:  Sinus rhythm Biatrial enlargement Borderline left axis deviation Anterior infarct, old Similar to prior Confirmed by Gwendolyn GrantWALDEN  MD, BLAIR [phone]([id number]) on 02/14/2013 8:49:00 PM Also confirmed by DOCHERTY  MD, MEGAN [phone]([id number])  on 02/14/2013 9:14:50 PM            MDM   1. Bronchitis    Pt is a 53 y.o. female with Pmhx as above who presents with about 5 weeks of cough, congestion, intermittent subjective fevers, myalgias, d/a sore throat.  On PE, VSS, pt in NAD.  She has course breath sounds w/ scattered wheezing throughout with good air mvmt and no resp distress.  CXR with likely bronchitis.  Rapid strep negative.  WIll d/c home w/ Rx for z-pack, mucinex, albuterol Q 4 hrs.  Return precautions given for new or worsening symptoms including worsening SOB, pain, inability to tolerate liquids         Shanna CiscoMegan E Docherty, MD 02/14/13 2337

## 2013-02-14 NOTE — ED Notes (Signed)
Patient is alert and oriented x3.  She is complaining of generalized body aches with chest pain.   She states that she has been having the pain for over the past week.  Currently she rates her pain 9 of 10 with diarrhea

## 2013-02-14 NOTE — Discharge Instructions (Signed)
Bronchitis °Bronchitis is a problem of the air tubes leading to your lungs. This problem makes it hard for air to get in and out of the lungs. You may cough a lot because your air tubes are narrow. Going without care can cause lasting (chronic) bronchitis. °HOME CARE  °· Drink enough fluids to keep your pee (urine) clear or pale yellow. °· Use a cool mist humidifier. °· Quit smoking if you smoke. If you keep smoking, the bronchitis might not get better. °· Only take medicine as told by your doctor. °GET HELP RIGHT AWAY IF:  °· Coughing keeps you awake. °· You start to wheeze. °· You become more sick or weak. °· You have a hard time breathing or get short of breath. °· You cough up blood. °· Coughing lasts more than 2 weeks. °· You have a fever. °· Your baby is older than 3 months with a rectal temperature of 102° F (38.9° C) or higher. °· Your baby is 3 months old or younger with a rectal temperature of 100.4° F (38° C) or higher. °MAKE SURE YOU: °· Understand these instructions. °· Will watch your condition. °· Will get help right away if you are not doing well or get worse. °Document Released: 07/15/2007 Document Revised: 04/20/2011 Document Reviewed: 09/20/2012 °[employer]® Patient Information ©2014 [employer]. ° °

## 2013-02-15 ENCOUNTER — Other Ambulatory Visit (HOSPITAL_COMMUNITY): Payer: Self-pay | Admitting: Psychiatry

## 2013-02-16 LAB — CULTURE, GROUP A STREP

## 2014-10-04 ENCOUNTER — Emergency Department (HOSPITAL_COMMUNITY)
Admission: EM | Admit: 2014-10-04 | Discharge: 2014-10-04 | Disposition: A | Discharge status: Home / Self Care | Payer: Self-pay | Attending: Emergency Medicine | Admitting: Emergency Medicine

## 2014-10-04 ENCOUNTER — Encounter (HOSPITAL_COMMUNITY): Payer: Self-pay | Admitting: Neurology

## 2014-10-04 ENCOUNTER — Emergency Department (HOSPITAL_COMMUNITY): Payer: Self-pay

## 2014-10-04 DIAGNOSIS — M1990 Unspecified osteoarthritis, unspecified site: Secondary | ICD-10-CM | POA: Insufficient documentation

## 2014-10-04 DIAGNOSIS — R059 Cough, unspecified: Secondary | ICD-10-CM

## 2014-10-04 DIAGNOSIS — J309 Allergic rhinitis, unspecified: Secondary | ICD-10-CM | POA: Insufficient documentation

## 2014-10-04 DIAGNOSIS — J029 Acute pharyngitis, unspecified: Secondary | ICD-10-CM | POA: Insufficient documentation

## 2014-10-04 DIAGNOSIS — Z720 Tobacco use: Secondary | ICD-10-CM | POA: Insufficient documentation

## 2014-10-04 DIAGNOSIS — Z8659 Personal history of other mental and behavioral disorders: Secondary | ICD-10-CM | POA: Insufficient documentation

## 2014-10-04 DIAGNOSIS — R05 Cough: Secondary | ICD-10-CM

## 2014-10-04 DIAGNOSIS — Z79899 Other long term (current) drug therapy: Secondary | ICD-10-CM | POA: Insufficient documentation

## 2014-10-04 LAB — RAPID STREP SCREEN (MED CTR MEBANE ONLY): Streptococcus, Group A Screen (Direct): NEGATIVE

## 2014-10-04 MED ORDER — CETIRIZINE HCL 10 MG PO TABS: 10.0000 mg | ORAL_TABLET | Freq: Every day | ORAL | Status: DC

## 2014-10-04 MED ORDER — NAPROXEN 250 MG PO TABS: 250.0000 mg | ORAL_TABLET | Freq: Two times a day (BID) | ORAL | Status: DC

## 2014-10-04 MED ORDER — BENZONATATE 100 MG PO CAPS: 100.0000 mg | ORAL_CAPSULE | Freq: Three times a day (TID) | ORAL | Status: DC

## 2014-10-04 MED ORDER — ALBUTEROL SULFATE HFA 108 (90 BASE) MCG/ACT IN AERS
2.0000 | INHALATION_SPRAY | Freq: Once | RESPIRATORY_TRACT | Status: AC
  Administered 2014-10-04: 2 via RESPIRATORY_TRACT
  Filled 2014-10-04: qty 6.7

## 2014-10-04 MED ORDER — ACETAMINOPHEN 325 MG PO TABS
650.0000 mg | ORAL_TABLET | Freq: Once | ORAL | Status: AC
  Administered 2014-10-04: 650 mg via ORAL
  Filled 2014-10-04: qty 2

## 2014-10-04 MED ORDER — ALBUTEROL SULFATE HFA 108 (90 BASE) MCG/ACT IN AERS: 2.0000 | INHALATION_SPRAY | RESPIRATORY_TRACT | Status: DC | PRN

## 2014-10-04 MED ORDER — FLUTICASONE PROPIONATE 50 MCG/ACT NA SUSP: 2.0000 | Freq: Every day | NASAL | Status: DC

## 2014-10-04 NOTE — ED Notes (Signed)
Pt A&OX4, ambulatory at d/c with steady gait, NAD 

## 2014-10-04 NOTE — ED Notes (Signed)
Pt reports sore throat for 2 weeks intermittently: also cough and congestion. Is a x 4

## 2014-10-04 NOTE — Discharge Instructions (Signed)
Allergic Rhinitis Allergic rhinitis is when the mucous membranes in the nose respond to allergens. Allergens are particles in the air that cause your body to have an allergic reaction. This causes you to release allergic antibodies. Through a chain of events, these eventually cause you to release histamine into the blood stream. Although meant to protect the body, it is this release of histamine that causes your discomfort, such as frequent sneezing, congestion, and an itchy, runny nose.  CAUSES  Seasonal allergic rhinitis (hay fever) is caused by pollen allergens that may come from grasses, trees, and weeds. Year-round allergic rhinitis (perennial allergic rhinitis) is caused by allergens such as house dust mites, pet dander, and mold spores.  SYMPTOMS   Nasal stuffiness (congestion).  Itchy, runny nose with sneezing and tearing of the eyes. DIAGNOSIS  Your health care provider can help you determine the allergen or allergens that trigger your symptoms. If you and your health care provider are unable to determine the allergen, skin or blood testing may be used. TREATMENT  Allergic rhinitis does not have a cure, but it can be controlled by:  Medicines and allergy shots (immunotherapy).  Avoiding the allergen. Hay fever may often be treated with antihistamines in pill or nasal spray forms. Antihistamines block the effects of histamine. There are over-the-counter medicines that may help with nasal congestion and swelling around the eyes. Check with your health care provider before taking or giving this medicine.  If avoiding the allergen or the medicine prescribed do not work, there are many new medicines your health care provider can prescribe. Stronger medicine may be used if initial measures are ineffective. Desensitizing injections can be used if medicine and avoidance does not work. Desensitization is when a patient is given ongoing shots until the body becomes less sensitive to the allergen.  Make sure you follow up with your health care provider if problems continue. HOME CARE INSTRUCTIONS It is not possible to completely avoid allergens, but you can reduce your symptoms by taking steps to limit your exposure to them. It helps to know exactly what you are allergic to so that you can avoid your specific triggers. SEEK MEDICAL CARE IF:   You have a fever.  You develop a cough that does not stop easily (persistent).  You have shortness of breath.  You start wheezing.  Symptoms interfere with normal daily activities. Document Released: 10/21/2000 Document Revised: 01/31/2013 Document Reviewed: 10/03/2012 [facility] Patient Information 2015 [place], [place]. This information is not intended to replace advice given to you by your health care provider. Make sure you discuss any questions you have with your health care provider. Pharyngitis Pharyngitis is redness, pain, and swelling (inflammation) of your pharynx.  CAUSES  Pharyngitis is usually caused by infection. Most of the time, these infections are from viruses (viral) and are part of a cold. However, sometimes pharyngitis is caused by bacteria (bacterial). Pharyngitis can also be caused by allergies. Viral pharyngitis may be spread from person to person by coughing, sneezing, and personal items or utensils (cups, forks, spoons, toothbrushes). Bacterial pharyngitis may be spread from person to person by more intimate contact, such as kissing.  SIGNS AND SYMPTOMS  Symptoms of pharyngitis include:   Sore throat.   Tiredness (fatigue).   Low-grade fever.   Headache.  Joint pain and muscle aches.  Skin rashes.  Swollen lymph nodes.  Plaque-like film on throat or tonsils (often seen with bacterial pharyngitis). DIAGNOSIS  Your health care provider will ask you questions about your  illness and your symptoms. Your medical history, along with a physical exam, is often all that is needed to diagnose pharyngitis. Sometimes, a  rapid strep test is done. Other lab tests may also be done, depending on the suspected cause.  TREATMENT  Viral pharyngitis will usually get better in 3-4 days without the use of medicine. Bacterial pharyngitis is treated with medicines that kill germs (antibiotics).  HOME CARE INSTRUCTIONS   Drink enough water and fluids to keep your urine clear or pale yellow.   Only take over-the-counter or prescription medicines as directed by your health care provider:   If you are prescribed antibiotics, make sure you finish them even if you start to feel better.   Do not take aspirin.   Get lots of rest.   Gargle with 8 oz of salt water ( tsp of salt per 1 qt of water) as often as every 1-2 hours to soothe your throat.   Throat lozenges (if you are not at risk for choking) or sprays may be used to soothe your throat. SEEK MEDICAL CARE IF:   You have large, tender lumps in your neck.  You have a rash.  You cough up green, yellow-brown, or bloody spit. SEEK IMMEDIATE MEDICAL CARE IF:   Your neck becomes stiff.  You drool or are unable to swallow liquids.  You vomit or are unable to keep medicines or liquids down.  You have severe pain that does not go away with the use of recommended medicines.  You have trouble breathing (not caused by a stuffy nose). MAKE SURE YOU:   Understand these instructions.  Will watch your condition.  Will get help right away if you are not doing well or get worse. Document Released: 01/26/2005 Document Revised: 11/16/2012 Document Reviewed: 10/03/2012 [facility] Patient Information 2015 [place], [place]. This information is not intended to replace advice given to you by your health care provider. Make sure you discuss any questions you have with your health care provider. Cough, Adult  A cough is a reflex that helps clear your throat and airways. It can help heal the body or may be a reaction to an irritated airway. A cough may only last 2 or 3 weeks  (acute) or may last more than 8 weeks (chronic).  CAUSES Acute cough:  Viral or bacterial infections. Chronic cough:  Infections.  Allergies.  Asthma.  Post-nasal drip.  Smoking.  Heartburn or acid reflux.  Some medicines.  Chronic lung problems (COPD).  Cancer. SYMPTOMS   Cough.  Fever.  Chest pain.  Increased breathing rate.  High-pitched whistling sound when breathing (wheezing).  Colored mucus that you cough up (sputum). TREATMENT   A bacterial cough may be treated with antibiotic medicine.  A viral cough must run its course and will not respond to antibiotics.  Your caregiver may recommend other treatments if you have a chronic cough. HOME CARE INSTRUCTIONS   Only take over-the-counter or prescription medicines for pain, discomfort, or fever as directed by your caregiver. Use cough suppressants only as directed by your caregiver.  Use a cold steam vaporizer or humidifier in your bedroom or home to help loosen secretions.  Sleep in a semi-upright position if your cough is worse at night.  Rest as needed.  Stop smoking if you smoke. SEEK IMMEDIATE MEDICAL CARE IF:   You have pus in your sputum.  Your cough starts to worsen.  You cannot control your cough with suppressants and are losing sleep.  You begin coughing up  blood.  You have difficulty breathing.  You develop pain which is getting worse or is uncontrolled with medicine.  You have a fever. MAKE SURE YOU:   Understand these instructions.  Will watch your condition.  Will get help right away if you are not doing well or get worse. Document Released: 07/25/2010 Document Revised: 04/20/2011 Document Reviewed: 07/25/2010 [facility] Patient Information 2015 [place], [place]. This information is not intended to replace advice given to you by your health care provider. Make sure you discuss any questions you have with your health care provider.

## 2014-10-04 NOTE — ED Provider Notes (Signed)
CSN: [id number]     Arrival date & time 10/04/14  1551 History  This chart was scribed for non-physician practitioner, Everlene Farrier, PA-C working with Lorre Nick, MD by Doreatha Martin, ED scribe. This patient was seen in room TR02C/TR02C and the patient's care was started at 4:36 PM  Chief Complaint  Patient presents with  . Sore Throat   The history is provided by the patient. No language interpreter was used.    HPI Comments: [name] is a 54 y.o. female who presents to the Emergency Department complaining of moderate, intermittent sore throat onset 2 weeks ago. She states associated HA, nasal congestion, cough onset 11 days ago, chest tightness. She notes that sore throat is worst in the morning. Pt notes she has been able to swallow foods and tolerate liquids. Pt is a current smoker, 1 ppd. No Hx of asthma. Pt was Dx with Bronchitis in 2015 and had a CXR with findings consistent with COPD. She denies rhinorrhea, postnasal drip, fever, chills, otalgia, ear pressure, abdominal pain, nausea, vomiting, wheezing, SOB.    Past Medical History  Diagnosis Date  . Depression   . Arthritis   . Seizures     2 seizures, last in April   Past Surgical History  Procedure Laterality Date  . C section     No family history on file. Social History  Substance Use Topics  . Smoking status: Current Every Day Smoker -- 1.50 packs/day for 30 years    Types: Cigarettes  . Smokeless tobacco: None  . Alcohol Use: 6.0 oz/week    12 drink(s) per week     Comment: Occassional drinker   OB History    No data available     Review of Systems  Constitutional: Negative for fever and chills.  HENT: Positive for congestion, sinus pressure, sneezing and sore throat. Negative for ear pain, facial swelling, postnasal drip, rhinorrhea and trouble swallowing.   Respiratory: Positive for cough and chest tightness. Negative for shortness of breath and wheezing.   Gastrointestinal: Negative for nausea,  vomiting and abdominal pain.  Skin: Negative for rash.  Neurological: Positive for headaches. Negative for light-headedness.    Allergies  Seroquel and Sulfa antibiotics  Home Medications   Prior to Admission medications   Medication Sig Start Date End Date Taking? Authorizing Provider  albuterol (PROVENTIL HFA;VENTOLIN HFA) 108 (90 BASE) MCG/ACT inhaler Inhale 2 puffs into the lungs every 4 (four) hours as needed for wheezing or shortness of breath. 10/04/14   Everlene Farrier, PA-C  azithromycin (ZITHROMAX Z-PAK) 250 MG tablet 2 po day one, then 1 daily x 4 days 02/14/13   Toy Cookey, MD  benzonatate (TESSALON) 100 MG capsule Take 1 capsule (100 mg total) by mouth every 8 (eight) hours. 10/04/14   Everlene Farrier, PA-C  cetirizine (ZYRTEC ALLERGY) 10 MG tablet Take 1 tablet (10 mg total) by mouth daily. 10/04/14   Everlene Farrier, PA-C  DM-Doxylamine-Acetaminophen (NYQUIL COLD & FLU PO) Take 1 capsule by mouth at bedtime as needed (flu-like symptoms).    Historical Provider, MD  fluticasone (FLONASE) 50 MCG/ACT nasal spray Place 2 sprays into both nostrils daily. 10/04/14   Everlene Farrier, PA-C  guaiFENesin (ROBITUSSIN) 100 MG/5ML liquid Take 5-10 mLs (100-200 mg total) by mouth every 4 (four) hours as needed for cough. 02/14/13   Toy Cookey, MD  guaifenesin (ROBITUSSIN) 100 MG/5ML syrup Take 200 mg by mouth 3 (three) times daily as needed for cough (COUGH).    Historical Provider, MD  naproxen (NAPROSYN) 250 MG tablet Take 1 tablet (250 mg total) by mouth 2 (two) times daily with a meal. 10/04/14   Everlene Farrier, PA-C  pseudoephedrine (SUDAFED 12 HOUR) 120 MG 12 hr tablet Take 120 mg by mouth every 12 (twelve) hours as needed for congestion (CONGESTION).    Historical Provider, MD  Pseudoephedrine-APAP-DM (DAYQUIL MULTI-SYMPTOM COLD/FLU PO) Take 1 capsule by mouth 2 (two) times daily as needed (FLU-LIKE SYMPTOMS).    Historical Provider, MD   BP 111/76 mmHg  Pulse 84  Temp(Src) 98 F (36.7  C) (Oral)  Resp 16  Ht 5\' 4"  (1.626 m)  Wt 115 lb (52.164 kg)  BMI 19.73 kg/m2  SpO2 98% Physical Exam  Constitutional: She is oriented to person, place, and time. She appears well-developed and well-nourished. No distress.  Non-toxic appearing.   HENT:  Head: Normocephalic and atraumatic.  Right Ear: External ear normal.  Left Ear: External ear normal.  Mouth/Throat: Uvula is midline and mucous membranes are normal. Posterior oropharyngeal erythema present. No oropharyngeal exudate, posterior oropharyngeal edema or tonsillar abscesses.  Boggy nasal turbinates. Mild postpharyngeal erythema, with no edema. No exudates. Uvula midline. No tonsillar abscess.  Bilateral tympanic membranes are pearly-gray without erythema or loss of landmarks.   Eyes: Conjunctivae are normal. Pupils are equal, round, and reactive to light. Right eye exhibits no discharge. Left eye exhibits no discharge.  Neck: Normal range of motion. Neck supple. No JVD present. No tracheal deviation present.  Mild submandibular cervical adenopathy.  Cardiovascular: Normal rate, regular rhythm, normal heart sounds and intact distal pulses.   Pulmonary/Chest: Effort normal and breath sounds normal. No respiratory distress. She has no wheezes. She has no rales.  Lung sounds clear to auscultation bilaterally. No wheezes or rhonchi. Patient speaking in full sentences.  Abdominal: Soft. Bowel sounds are normal. She exhibits no distension. There is no tenderness.  Musculoskeletal: Normal range of motion. She exhibits no edema or tenderness.  No BLE edema or tenderness.  Lymphadenopathy:    She has cervical adenopathy.  Neurological: She is alert and oriented to person, place, and time. Coordination normal.  Skin: Skin is warm and dry. No rash noted. She is not diaphoretic. No erythema. No pallor.  Psychiatric: She has a normal mood and affect. Her behavior is normal.  Nursing note and vitals reviewed.  ED Course  Procedures  (including critical care time) DIAGNOSTIC STUDIES: Oxygen Saturation is 97% on RA, normal by my interpretation.    COORDINATION OF CARE: 4:44 PM Discussed treatment plan with pt at bedside and pt agreed to plan.   Labs Review Labs Reviewed  RAPID STREP SCREEN (NOT AT [facility])  CULTURE, GROUP A STREP    Imaging Review Dg Chest 2 View  10/04/2014   CLINICAL DATA:  Cough for 2 weeks.  EXAM: CHEST  2 VIEW  COMPARISON:  02/14/2013  FINDINGS: Cardiac silhouette is normal in size and configuration. Normal mediastinal and hilar contours.  Clear lungs.  No pleural effusion or pneumothorax.  Skeletal structures are demineralized but intact.  IMPRESSION: No active cardiopulmonary disease.   Electronically Signed   By: Amie Portland M.D.   On: 10/04/2014 17:01   I have personally reviewed and evaluated these images and lab results as part of my medical decision-making.   EKG Interpretation None      Filed Vitals:   10/04/14 1556 10/04/14 1713  BP: 132/86 111/76  Pulse: 97 84  Temp: 98.4 F (36.9 C) 98 F (36.7 C)  TempSrc: Oral  Oral  Resp: 16   Height:   (1.626 m)  Weight:  115 lb (52.164 kg)  SpO2: 97% 98%     MDM   Meds given in ED:  Medications  albuterol (PROVENTIL HFA;VENTOLIN HFA) 108 (90 BASE) MCG/ACT inhaler 2 puff (2 puffs Inhalation Given 10/04/14 1712)  acetaminophen (TYLENOL) tablet 650 mg (650 mg Oral Given 10/04/14 1712)    Discharge Medication List as of 10/04/2014  5:13 PM    START taking these medications   Details  albuterol (PROVENTIL HFA;VENTOLIN HFA) 108 (90 BASE) MCG/ACT inhaler Inhale 2 puffs into the lungs every 4 (four) hours as needed for wheezing or shortness of breath., Starting 10/04/2014, Until Discontinued, Print    benzonatate (TESSALON) 100 MG capsule Take 1 capsule (100 mg total) by mouth every 8 (eight) hours., Starting 10/04/2014, Until Discontinued, Print    cetirizine (ZYRTEC ALLERGY) 10 MG tablet Take 1 tablet (10 mg total) by mouth  daily., Starting 10/04/2014, Until Discontinued, Print    fluticasone (FLONASE) 50 MCG/ACT nasal spray Place 2 sprays into both nostrils daily., Starting 10/04/2014, Until Discontinued, Print    naproxen (NAPROSYN) 250 MG tablet Take 1 tablet (250 mg total) by mouth 2 (two) times daily with a meal., Starting 10/04/2014, Until Discontinued, Print        Final diagnoses:  Cough  Allergic rhinitis, unspecified allergic rhinitis type  Sore throat   This is a 54 y.o. female who presents to the Emergency Department complaining of moderate, intermittent sore throat onset 2 weeks ago. She states associated HA, nasal congestion, cough onset 11 days ago, chest tightness. She notes that sore throat is worst in the morning.  On exam patient is afebrile and nontoxic appearing. Patient's lungs are clear to auscultation bilaterally. Patient in full sentences. Patient has mild posterior oropharyngeal erythema without edema. She has no tonsillar hypertrophy or exudates. She is boggy nasal turbinates bilaterally. Suspected patient's cough is related to her allergy symptoms. Rapid strep is negative. Chest x-ray is unremarkable. Will start patient on Flonase, Zyrtec and Tessalon Perles for cough. Patient also given albuterol inhaler in the emergency department. She denies shortness of breath. She is not hypoxic, tachypneic, or tachycardic. I advised the patient to follow-up with their primary care provider this week. I advised the patient to return to the emergency department with new or worsening symptoms or new concerns. The patient verbalized understanding and agreement with plan.    I personally performed the services described in this documentation, which was scribed in my presence. The recorded information has been reviewed and is accurate.     Everlene Farrier, PA-C 10/04/14 1721  Lorre Nick, MD 10/04/14 [phone]

## 2014-10-07 LAB — CULTURE, GROUP A STREP

## 2017-10-19 ENCOUNTER — Ambulatory Visit (INDEPENDENT_AMBULATORY_CARE_PROVIDER_SITE_OTHER): Payer: Self-pay

## 2017-10-19 ENCOUNTER — Encounter (HOSPITAL_COMMUNITY): Payer: Self-pay | Admitting: Emergency Medicine

## 2017-10-19 ENCOUNTER — Ambulatory Visit (HOSPITAL_COMMUNITY)
Admission: EM | Admit: 2017-10-19 | Discharge: 2017-10-19 | Disposition: A | Discharge status: Home / Self Care | Payer: Self-pay | Attending: Family Medicine | Admitting: Family Medicine

## 2017-10-19 DIAGNOSIS — M79671 Pain in right foot: Secondary | ICD-10-CM

## 2017-10-19 MED ORDER — NAPROXEN 500 MG PO TABS: 500.0000 mg | ORAL_TABLET | Freq: Two times a day (BID) | ORAL | 0 refills | Status: DC

## 2017-10-19 NOTE — ED Triage Notes (Signed)
Pt sts right foot pain x 2 weeks; pt sts recently injured

## 2017-10-19 NOTE — ED Provider Notes (Signed)
[facility]   [id number] 10/19/17 Arrival Time: 1443  ASSESSMENT & PLAN:  1. Foot pain, right    Imaging: Dg Foot Complete Right  Result Date: 10/19/2017 CLINICAL DATA:  Right foot pain after injury 2 weeks ago. EXAM: RIGHT FOOT COMPLETE - 3+ VIEW COMPARISON:  Radiographs of October 16, 2010. FINDINGS: Old healed second and third metatarsal fractures are noted. No acute fracture or dislocation is noted. Joint spaces are unremarkable. No soft tissue abnormality is noted. IMPRESSION: No acute abnormality seen in the right foot. Electronically Signed   By: Lupita Raider, M.D.   On: 10/19/2017 15:41   Meds ordered this encounter  Medications  . naproxen (NAPROSYN) 500 MG tablet    Sig: Take 1 tablet (500 mg total) by mouth 2 (two) times daily.    Dispense:  20 tablet    Refill:  0   Placed in walking boot.  Follow-up Information    Arfeen, Phillips Grout, MD.   Specialty:  Psychiatry Why:  If you are not seeing improvement over the next 1-2 weeks. Contact information: [address] [place] [address] [phone]          Reviewed expectations re: course of current medical issues. Questions answered. Outlined signs and symptoms indicating need for more acute intervention. Patient verbalized understanding. After Visit Summary given.  SUBJECTIVE: History from: patient. [name] is a 57 y.o. female who reports fairly persistent mild to moderate pain of her right foot that has stabilized since beginning; described as aching without radiation. Onset: gradual, over the past 2 weeks. Injury/trama: yes, questions twisting foot. Relieved by: rest. Worsened by: prolonged standing or weight bearing. Associated symptoms: none reported. Extremity sensation changes or weakness: none. Self treatment: has not tried OTCs for relief of pain. History of similar: no  ROS: As per HPI.   OBJECTIVE:  Vitals:   10/19/17 1520  BP: (!) 128/99  Pulse: 89  Resp: 18    Temp: 97.9 F (36.6 C)  TempSrc: Oral  SpO2: 98%    General appearance: alert; no distress Extremities: warm and well perfused; symmetrical with no gross deformities; poorly localized tenderness over her right dorsal foot with mild swelling and no bruising; ROM around area or areas of discomfort: normal CV: brisk extremity capillary refill Skin: warm and dry Neurologic: normal gait; normal symmetric reflexes in all extremities; normal sensation in all extremities Psychological: alert and cooperative; normal mood and affect  Allergies  Allergen Reactions  . Seroquel [Quetiapine Fumarate] Anaphylaxis    Pt reports that it made her throat close  . Sulfa Antibiotics Anaphylaxis    Past Medical History:  Diagnosis Date  . Arthritis   . Depression   . Seizures (HCC)    2 seizures, last in April   Social History   Socioeconomic History  . Marital status: Legally Separated    Spouse name: Not on file  . Number of children: Not on file  . Years of education: Not on file  . Highest education level: Not on file  Occupational History  . Not on file  Social Needs  . Financial resource strain: Not on file  . Food insecurity:    Worry: Not on file    Inability: Not on file  . Transportation needs:    Medical: Not on file    Non-medical: Not on file  Tobacco Use  . Smoking status: Current Every Day Smoker    Packs/day: 1.50    Years: 30.00  Pack years: 45.00    Types: Cigarettes  Substance and Sexual Activity  . Alcohol use: Yes    Alcohol/week: 12.0 standard drinks    Types: 12 drink(s) per week    Comment: Occassional drinker  . Drug use: No  . Sexual activity: Yes    Birth control/protection: None  Lifestyle  . Physical activity:    Days per week: Not on file    Minutes per session: Not on file  . Stress: Not on file  Relationships  . Social connections:    Talks on phone: Not on file    Gets together: Not on file    Attends religious service: Not on file     Active member of club or organization: Not on file    Attends meetings of clubs or organizations: Not on file    Relationship status: Not on file  Other Topics Concern  . Not on file  Social History Narrative   ** Merged History Encounter **       History reviewed. No pertinent family history. Past Surgical History:  Procedure Laterality Date  . c section        Mardella Layman, MD 10/20/17 [phone]

## 2017-10-19 NOTE — ED Notes (Signed)
Patient is self pay, decided against the cam walker due to price. States she has a friend who has a boot she can borrow.

## 2019-07-20 ENCOUNTER — Encounter (HOSPITAL_COMMUNITY): Payer: Self-pay | Admitting: Emergency Medicine

## 2019-07-20 ENCOUNTER — Ambulatory Visit (HOSPITAL_COMMUNITY)
Admission: EM | Admit: 2019-07-20 | Discharge: 2019-07-20 | Disposition: A | Discharge status: Home / Self Care | Payer: Self-pay

## 2019-07-20 DIAGNOSIS — K649 Unspecified hemorrhoids: Secondary | ICD-10-CM

## 2019-07-20 NOTE — Discharge Instructions (Addendum)
You can do hydrocortisone cream over-the-counter for hemorrhoids Colace and MiraLAX for constipation 3 contacts put on your discharge instructions for primary care follow-up.

## 2019-07-20 NOTE — ED Triage Notes (Signed)
Pt c/o rectal bleeding onset last Friday. Pt states she has a hx of hemorrhoids. BRB in the commode and when wiping. Pt states she then had another similar episode on Tuesday. Pt states she is have pain with BMs and lower abd pain.

## 2019-07-22 NOTE — ED Provider Notes (Signed)
[facility]    CSN: [id number] Arrival date & time: 07/20/19  1131      History   Chief Complaint Chief Complaint  Patient presents with   Rectal Bleeding    HPI [name] is a 59 y.o. female.   Patient is a 59 year old female who presents today with rectal bleeding onset last Friday.  Reporting history of hemorrhoids.  Bright red blood in the commode and when wiping.  Does have history of constipation and straining with bowel movements.  This is been intermittent.  Some pain with bowel movements and lower abdominal discomfort.  No black stools, blood clots.  Denies any nausea, vomiting.  Has not done anything to treat his symptoms.  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      Home Medications    Prior to Admission medications   Medication Sig Start Date End Date Taking? Authorizing Provider  albuterol (PROVENTIL HFA;VENTOLIN HFA) 108 (90 BASE) MCG/ACT inhaler Inhale 2 puffs into the lungs every 4 (four) hours as needed for wheezing or shortness of breath. 10/04/14   Everlene Farrier, PA-C  cetirizine (ZYRTEC ALLERGY) 10 MG tablet Take 1 tablet (10 mg total) by mouth daily. 10/04/14   Everlene Farrier, PA-C  DM-Doxylamine-Acetaminophen (NYQUIL COLD & FLU PO) Take 1 capsule by mouth at bedtime as needed (flu-like symptoms).    [provider]  fluticasone (FLONASE) 50 MCG/ACT nasal spray Place 2  sprays into both nostrils daily. 10/04/14   Everlene Farrier, PA-C  guaiFENesin (ROBITUSSIN) 100 MG/5ML liquid Take 5-10 mLs (100-200 mg total) by mouth every 4 (four) hours as needed for cough. 02/14/13   Toy Cookey, MD  guaifenesin (ROBITUSSIN) 100 MG/5ML syrup Take 200 mg by mouth 3 (three) times daily as needed for cough (COUGH).    [provider]  naproxen (NAPROSYN) 500 MG tablet Take 1 tablet (500 mg total) by mouth 2 (two) times daily. 10/19/17   Mardella Layman, MD  pseudoephedrine (SUDAFED 12 HOUR) 120 MG 12 hr tablet Take 120 mg by mouth every 12 (twelve) hours as needed for congestion (CONGESTION).    [provider]  Pseudoephedrine-APAP-DM (DAYQUIL MULTI-SYMPTOM COLD/FLU PO) Take 1 capsule by mouth 2 (two) times daily as needed (FLU-LIKE SYMPTOMS).    [provider]    Family History History reviewed. No pertinent family history.  Social History Social History   Tobacco Use   Smoking status: Current Every Day Smoker    Packs/day: 1.50    Years: 30.00    Pack years: 45.00    Types: Cigarettes   Smokeless tobacco: Never Used  Substance Use Topics   Alcohol use: Yes    Alcohol/week: 12.0 standard drinks    Types: 12 Standard drinks or equivalent per week    Comment: Occassional drinker   Drug use: No  Allergies   Seroquel [quetiapine fumarate] and Sulfa antibiotics   Review of Systems Review of Systems   Physical Exam Triage Vital Signs ED Triage Vitals  Enc Vitals Group     BP 07/20/19 1200 124/84     Pulse Rate 07/20/19 1200 88     Resp 07/20/19 1200 18     Temp 07/20/19 1200 97.9 F (36.6 C)     Temp Source 07/20/19 1200 Oral     SpO2 07/20/19 1200 97 %     Weight --      Height --      Head Circumference --      Peak Flow --      Pain Score 07/20/19 1158 8     Pain Loc --      Pain Edu? --      Excl. in [place]? --    No data found.  Updated Vital Signs BP 124/84 (BP Location: Left Arm)    Pulse 88    Temp 97.9  F (36.6 C) (Oral)    Resp 18    SpO2 97%   Visual Acuity Right Eye Distance:   Left Eye Distance:   Bilateral Distance:    Right Eye Near:   Left Eye Near:    Bilateral Near:     Physical Exam Vitals and nursing note reviewed.  Constitutional:      General: She is not in acute distress.    Appearance: Normal appearance. She is not ill-appearing, toxic-appearing or diaphoretic.  HENT:     Head: Normocephalic.     Nose: Nose normal.     Mouth/Throat:     Pharynx: Oropharynx is clear.  Eyes:     Conjunctiva/sclera: Conjunctivae normal.  Pulmonary:     Effort: Pulmonary effort is normal.  Abdominal:     Palpations: Abdomen is soft.     Tenderness: There is no abdominal tenderness.  Genitourinary:    Comments: Small external hemorrhoid, not thrombosed or bleeding.  Non tender.  Musculoskeletal:        General: Normal range of motion.     Cervical back: Normal range of motion.  Skin:    General: Skin is warm and dry.     Findings: No rash.  Neurological:     Mental Status: She is alert.  Psychiatric:        Mood and Affect: Mood normal.      UC Treatments / Results  Labs (all labs ordered are listed, but only abnormal results are displayed) Labs Reviewed - No data to display  EKG   Radiology No results found.  Procedures Procedures (including critical care time)  Medications Ordered in UC Medications - No data to display  Initial Impression / Assessment and Plan / UC Course  I have reviewed the triage vital signs and the nursing notes.  Pertinent labs & imaging results that were available during my care of the patient were reviewed by me and considered in my medical decision making (see chart for details).     Hemorrhoid Small nonbleeding nonthrombosed or painful hemorrhoid. Recommended over-the-counter hydrocortisone cream Colace for stool softener and MiraLAX for constipation as needed Contact given for primary care follow-up Final Clinical  Impressions(s) / UC Diagnoses   Final diagnoses:  Hemorrhoids, unspecified hemorrhoid type     Discharge Instructions     You can do hydrocortisone cream over-the-counter for hemorrhoids Colace and MiraLAX for constipation 3 contacts put on your discharge instructions for primary care follow-up.  ED Prescriptions    None     PDMP not reviewed this encounter.   Janace Aris, NP 07/22/19 1106

## 2020-08-08 ENCOUNTER — Other Ambulatory Visit: Payer: Self-pay

## 2020-08-08 ENCOUNTER — Ambulatory Visit (HOSPITAL_COMMUNITY)
Admission: EM | Admit: 2020-08-08 | Discharge: 2020-08-08 | Disposition: A | Discharge status: Home / Self Care | Payer: Self-pay | Attending: Emergency Medicine | Admitting: Emergency Medicine

## 2020-08-08 ENCOUNTER — Encounter (HOSPITAL_COMMUNITY): Payer: Self-pay

## 2020-08-08 ENCOUNTER — Ambulatory Visit (INDEPENDENT_AMBULATORY_CARE_PROVIDER_SITE_OTHER): Payer: Self-pay

## 2020-08-08 DIAGNOSIS — S93602A Unspecified sprain of left foot, initial encounter: Secondary | ICD-10-CM

## 2020-08-08 DIAGNOSIS — M79672 Pain in left foot: Secondary | ICD-10-CM

## 2020-08-08 DIAGNOSIS — R030 Elevated blood-pressure reading, without diagnosis of hypertension: Secondary | ICD-10-CM

## 2020-08-08 NOTE — ED Provider Notes (Signed)
[facility]    CSN: [id number] Arrival date & time: 08/08/20  1757      History   Chief Complaint Chief Complaint  Patient presents with   Foot Pain    HPI [name] is a 60 y.o. female.   60 year old female presents to urgent care chief complaint of left foot pain from repeat injury(3 times this month), states she tripped over a rug , fell on gumball, twisted foot.  Patient reports pain and swelling to left midfoot.  Took BC with minimal relief.   The history is provided by the patient. No language interpreter was used.   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      Home Medications    Prior to Admission medications   Medication Sig Start Date End Date Taking? Authorizing Provider  albuterol (PROVENTIL HFA;VENTOLIN HFA) 108 (90 BASE) MCG/ACT inhaler Inhale 2 puffs into the lungs every 4 (four) hours as needed for wheezing or shortness of breath. 10/04/14   Everlene Farrier, PA-C  cetirizine (ZYRTEC ALLERGY) 10 MG tablet Take 1 tablet (10 mg total) by mouth daily. 10/04/14   Everlene Farrier, PA-C  DM-Doxylamine-Acetaminophen (NYQUIL COLD & FLU PO) Take 1 capsule by mouth at bedtime as needed (flu-like symptoms).    [provider]  fluticasone (FLONASE) 50 MCG/ACT nasal spray Place 2 sprays into both nostrils daily. 10/04/14   Everlene Farrier, PA-C   guaiFENesin (ROBITUSSIN) 100 MG/5ML liquid Take 5-10 mLs (100-200 mg total) by mouth every 4 (four) hours as needed for cough. 02/14/13   Toy Cookey, MD  guaifenesin (ROBITUSSIN) 100 MG/5ML syrup Take 200 mg by mouth 3 (three) times daily as needed for cough (COUGH).    [provider]  naproxen (NAPROSYN) 500 MG tablet Take 1 tablet (500 mg total) by mouth 2 (two) times daily. 10/19/17   Mardella Layman, MD  pseudoephedrine (SUDAFED 12 HOUR) 120 MG 12 hr tablet Take 120 mg by mouth every 12 (twelve) hours as needed for congestion (CONGESTION).    [provider]  Pseudoephedrine-APAP-DM (DAYQUIL MULTI-SYMPTOM COLD/FLU PO) Take 1 capsule by mouth 2 (two) times daily as needed (FLU-LIKE SYMPTOMS).    [provider]    Family History History reviewed. No pertinent family history.  Social History Social History   Tobacco Use   Smoking status: Every Day    Packs/day: 1.50    Years: 30.00    Pack years: 45.00    Types: Cigarettes   Smokeless tobacco: Never  Substance Use Topics   Alcohol use: Yes    Alcohol/week: 12.0 standard drinks    Types: 12 Standard drinks or equivalent per week    Comment: Occassional drinker   Drug use: No     Allergies   Seroquel [quetiapine fumarate] and Sulfa antibiotics   Review  of Systems Review of Systems  Musculoskeletal:  Positive for gait problem and myalgias.  Skin:  Negative for color change and wound.  All other systems reviewed and are negative.   Physical Exam Triage Vital Signs ED Triage Vitals  Enc Vitals Group     BP 08/08/20 1849 (!) 161/98     Pulse Rate 08/08/20 1849 89     Resp 08/08/20 1849 16     Temp 08/08/20 1849 98.9 F (37.2 C)     Temp Source 08/08/20 1849 Oral     SpO2 08/08/20 1849 100 %     Weight --      Height --      Head Circumference --      Peak Flow --      Pain Score 08/08/20 1850 8     Pain Loc --      Pain Edu? --      Excl. in [place]? --    No data found.  Updated  Vital Signs BP (!) 161/98 (BP Location: Left Arm)   Pulse 89   Temp 98.9 F (37.2 C) (Oral)   Resp 16   SpO2 100%   Visual Acuity Right Eye Distance:   Left Eye Distance:   Bilateral Distance:    Right Eye Near:   Left Eye Near:    Bilateral Near:     Physical Exam Vitals and nursing note reviewed.  Musculoskeletal:     Left foot: Normal range of motion and normal capillary refill. Tenderness and bony tenderness present.       Legs:  Skin:    General: Skin is warm and dry.     Capillary Refill: Capillary refill takes less than 2 seconds.  Neurological:     General: No focal deficit present.     Mental Status: She is alert and oriented to person, place, and time.  Psychiatric:        Mood and Affect: Mood normal.        Behavior: Behavior normal.     UC Treatments / Results  Labs (all labs ordered are listed, but only abnormal results are displayed) Labs Reviewed - No data to display  EKG   Radiology DG Foot Complete Left  Result Date: 08/08/2020 CLINICAL DATA:  Left foot pain, injury EXAM: LEFT FOOT - COMPLETE 3+ VIEW COMPARISON:  None. FINDINGS: There is no evidence of fracture or dislocation. There is no evidence of arthropathy or other focal bone abnormality. Soft tissues are unremarkable. IMPRESSION: Negative. Electronically Signed   By: Charlett Nose M.D.   On: 08/08/2020 19:21    Procedures Procedures (including critical care time)  Medications Ordered in UC Medications - No data to display  Initial Impression / Assessment and Plan / UC Course  I have reviewed the triage vital signs and the nursing notes.  Pertinent labs & imaging results that were available during my care of the patient were reviewed by me and considered in my medical decision making (see chart for details).     Left foot sprain, left foot pain, fracture, dislocation. Final Clinical Impressions(s) / UC Diagnoses   Final diagnoses:  Foot sprain, left, initial encounter  Foot  pain, left  Elevated blood pressure reading     Discharge Instructions      Your x-ray was negative for fracture or dislocation.  Rest ice wear Ace wrap crutches.  May take over-the-counter Tylenol ibuprofen as label directed for discomfort pain management.  Follow-up with PCP/orthopedics if pain  persist over 1 week-call for appointment.  Please follow-up and have your blood pressure retaken as it was elevated in urgent care today     ED Prescriptions   None    PDMP not reviewed this encounter.   Clancy Gourd, NP 08/08/20 2107

## 2020-08-08 NOTE — Discharge Instructions (Addendum)
Your x-ray was negative for fracture or dislocation.  Rest ice wear Ace wrap crutches.  May take over-the-counter Tylenol ibuprofen as label directed for discomfort pain management.  Follow-up with PCP/orthopedics if pain persist over 1 week-call for appointment.  Please follow-up and have your blood pressure retaken as it was elevated in urgent care today

## 2020-08-08 NOTE — ED Triage Notes (Signed)
Pt present left foot pain from some injuries of tripping and falling. Pt states she has hurt the left foot 3x within this month.  Pt states it hurts to put pressure on her foot and to touch it.

## 2023-04-13 ENCOUNTER — Emergency Department (HOSPITAL_COMMUNITY)
Admission: EM | Admit: 2023-04-13 | Discharge: 2023-04-13 | Disposition: A | Discharge status: Home / Self Care | Payer: MEDICAID | Attending: Emergency Medicine | Admitting: Emergency Medicine

## 2023-04-13 DIAGNOSIS — M5432 Sciatica, left side: Secondary | ICD-10-CM | POA: Insufficient documentation

## 2023-04-13 DIAGNOSIS — M25552 Pain in left hip: Secondary | ICD-10-CM | POA: Diagnosis present

## 2023-04-13 NOTE — ED Triage Notes (Signed)
 Pt states she was recently seen for a fall and L hip pain. Pt states that she was out from work for a week and then returned to work and has trouble standing/walking d/t pain. Pt reports that she has hx of arthritis and takes excessive amounts of Goody's Powder (6-8) and Aleve (6 pills) per day. Denies GI symptoms.

## 2023-04-13 NOTE — Discharge Instructions (Signed)
 You were seen in the emergency department today for concerns of chest pain.  Your physical exam was consistent with sciatic based pain.  We discussed several different stretches that can be helpful and we setting up and addressing some of this discomfort.  You should continue to take Elavil, ibuprofen, and the, for your pain.  You are currently taking Goody's powder which is an aspirin medication to be cautious that this can cause ulcers and gastrointestinal bleeding if taken for prolonged periods.

## 2023-04-13 NOTE — ED Provider Notes (Signed)
 [place] EMERGENCY DEPARTMENT AT [facility] Provider Note   CSN: [id number] Arrival date & time: 04/13/23  1722     History Chief Complaint  Patient presents with   Hip Pain    [name] is a 63 y.o. female.  Patient without past medical history presents to the emergency department today with concerns of hip pain.  She reports that she had a mechanical fall about 3 to 3-week left since then.  Reports some difficulty with ambulation due to the radiating symptoms down the left leg.  Denies any tingling, numbness, paresthesia, bowel or bladder incontinence.  States that she was not evaluated after this initial injury.  She states that she has been trying to take Goody's powder and Aleve with minimal improvement in symptoms.  Denies any gastrointestinal discomfort.   Hip Pain       Home Medications Prior to Admission medications   Medication Sig Start Date End Date Taking? Authorizing Provider  albuterol (PROVENTIL HFA;VENTOLIN HFA) 108 (90 BASE) MCG/ACT inhaler Inhale 2 puffs into the lungs every 4 (four) hours as needed for wheezing or shortness of breath. 10/04/14   Everlene Farrier, PA-C  cetirizine (ZYRTEC ALLERGY) 10 MG tablet Take 1 tablet (10 mg total) by mouth daily. 10/04/14   Everlene Farrier, PA-C  DM-Doxylamine-Acetaminophen (NYQUIL COLD & FLU PO) Take 1 capsule by mouth at bedtime as needed (flu-like symptoms).    [provider]  fluticasone (FLONASE) 50 MCG/ACT nasal spray Place 2 sprays into both nostrils daily. 10/04/14   Everlene Farrier, PA-C  guaiFENesin (ROBITUSSIN) 100 MG/5ML liquid Take 5-10 mLs (100-200 mg total) by mouth every 4 (four) hours as needed for cough. 02/14/13   Toy Cookey, MD  guaifenesin (ROBITUSSIN) 100 MG/5ML syrup Take 200 mg by mouth 3 (three) times daily as needed for cough (COUGH).    [provider]  naproxen (NAPROSYN) 500 MG tablet Take 1 tablet (500 mg total) by mouth 2 (two) times daily. 10/19/17   Mardella Layman, MD  pseudoephedrine (SUDAFED 12 HOUR) 120 MG 12 hr tablet Take 120 mg by mouth every 12 (twelve) hours as needed for congestion (CONGESTION).    [provider]  Pseudoephedrine-APAP-DM (DAYQUIL MULTI-SYMPTOM COLD/FLU PO) Take 1 capsule by mouth 2 (two) times daily as needed (FLU-LIKE SYMPTOMS).    [provider]      Allergies    Seroquel [quetiapine fumarate] and Sulfa antibiotics    Review of Systems   Review of Systems  Musculoskeletal:        Hip pain  All other systems reviewed and are negative.   Physical Exam Updated Vital Signs BP 124/75 (BP Location: Right Arm)   Pulse 75   Temp 97.8 F (36.6 C) (Oral)   Resp 15   SpO2 100%  Physical Exam Vitals and nursing note reviewed.  Constitutional:      General: She is not in acute distress.    Appearance: She is well-developed.  HENT:     Head: Normocephalic and atraumatic.  Eyes:     Conjunctiva/sclera: Conjunctivae normal.  Cardiovascular:     Rate and Rhythm: Normal rate and regular rhythm.     Heart sounds: No murmur heard. Pulmonary:     Effort: Pulmonary effort is normal. No respiratory distress.     Breath sounds: Normal breath sounds.  Abdominal:     Palpations: Abdomen is soft.     Tenderness: There is no abdominal tenderness.  Musculoskeletal:  General: Tenderness present. No swelling, deformity or signs of injury.     Cervical back: Neck supple.     Comments: Positive straight leg raise on the left side.  Some tenderness to palpation along the bursa of the left femur.  No altered sensation or diminished mobility.  Patient able to tolerate weightbearing and ambulation.  Skin:    General: Skin is warm and dry.     Capillary Refill: Capillary refill takes less than 2 seconds.  Neurological:     Mental Status: She is alert.  Psychiatric:        Mood and Affect: Mood normal.     ED Results / Procedures / Treatments   Labs (all labs ordered are listed, but only abnormal  results are displayed) Labs Reviewed - No data to display  EKG None  Radiology No results found.  Procedures Procedures    Medications Ordered in ED Medications - No data to display  ED Course/ Medical Decision Making/ A&P                                 Medical Decision Making  This patient presents to the ED for concern of hip pain.  Differential diagnosis includes bursitis, sciatica, pelvic fracture, femoral neck fracture   Problem List / ED Course:  Patient presents to the emergency department today with concerns of hip pain.  Reports that she had a mechanical fall about 2 or 3 weeks ago.  States that she is not having any significant symptoms over the last 2 weeks.  She has now progressed that has been worsening the left hip pain with radiation to the lower extremity.  Denies any tingling, numbness, saddle paresthesia, loss of bowel or bladder control.  No significant relief in symptoms with Goody's powder and Aleve.  She denies any GI symptoms.  Denies any other recent trauma and states that she feels tolerate ambulation but it is uncomfortable. Physical exam is reassuring although there are findings suggesting sciatic pain.  Patient has a positive straight leg raise on the left side.  There is also some mild tenderness palpation along the intertrochanteric bursa of the left femur.  Less likely be bursitis and more likely to be sciatica given radiating symptoms into the lower extremity.  Given reassuring findings with no red flag symptoms such as saddle paresthesia, bowel or bladder incontinence, or unilateral weakness or numbness, patient likely would not benefit from advanced imaging at this time.  Advised patient that typical symptoms of sciatic pain will last anywhere from 2 to 6 weeks.  Return precautions discussed such as development of red flag symptoms.  Patient will trial continued use of anti-inflammatories at home.  Caution against heavy use of combination of Goody's  powder and NSAIDs emesis is going to increase her risk of an ulcer or GI bleed.  Patient verbalized understanding and will alternate ibuprofen with Tylenol.  Patient is otherwise reassuring and agreeable with plans for discharge home and outpatient follow-up.  Patient discharged home in stable condition.   Final Clinical Impression(s) / ED Diagnoses Final diagnoses:  Sciatica of left side    Rx / DC Orders ED Discharge Orders     None         Smitty Knudsen, PA-C 04/14/23 2318    Charlynne Pander, MD 04/16/23 1530

## 2023-04-20 ENCOUNTER — Emergency Department (HOSPITAL_COMMUNITY): Payer: MEDICAID

## 2023-04-20 ENCOUNTER — Inpatient Hospital Stay (HOSPITAL_COMMUNITY)
Admission: EM | Admit: 2023-04-20 | Discharge: 2023-04-24 | DRG: 521 | Disposition: A | Discharge status: Home / Self Care | Payer: MEDICAID | Attending: Internal Medicine | Admitting: Internal Medicine

## 2023-04-20 ENCOUNTER — Encounter (HOSPITAL_COMMUNITY): Payer: Self-pay

## 2023-04-20 ENCOUNTER — Other Ambulatory Visit: Payer: Self-pay

## 2023-04-20 DIAGNOSIS — S72042A Displaced fracture of base of neck of left femur, initial encounter for closed fracture: Principal | ICD-10-CM | POA: Diagnosis present

## 2023-04-20 DIAGNOSIS — Z882 Allergy status to sulfonamides status: Secondary | ICD-10-CM

## 2023-04-20 DIAGNOSIS — Z888 Allergy status to other drugs, medicaments and biological substances status: Secondary | ICD-10-CM

## 2023-04-20 DIAGNOSIS — S72012A Unspecified intracapsular fracture of left femur, initial encounter for closed fracture: Secondary | ICD-10-CM | POA: Diagnosis present

## 2023-04-20 DIAGNOSIS — S72045A Nondisplaced fracture of base of neck of left femur, initial encounter for closed fracture: Secondary | ICD-10-CM | POA: Diagnosis not present

## 2023-04-20 DIAGNOSIS — W010XXA Fall on same level from slipping, tripping and stumbling without subsequent striking against object, initial encounter: Secondary | ICD-10-CM | POA: Diagnosis present

## 2023-04-20 DIAGNOSIS — D72829 Elevated white blood cell count, unspecified: Secondary | ICD-10-CM | POA: Diagnosis present

## 2023-04-20 DIAGNOSIS — M8588 Other specified disorders of bone density and structure, other site: Secondary | ICD-10-CM | POA: Diagnosis present

## 2023-04-20 DIAGNOSIS — F418 Other specified anxiety disorders: Secondary | ICD-10-CM | POA: Diagnosis not present

## 2023-04-20 DIAGNOSIS — E43 Unspecified severe protein-calorie malnutrition: Secondary | ICD-10-CM | POA: Diagnosis present

## 2023-04-20 DIAGNOSIS — M159 Polyosteoarthritis, unspecified: Secondary | ICD-10-CM | POA: Diagnosis present

## 2023-04-20 DIAGNOSIS — F319 Bipolar disorder, unspecified: Secondary | ICD-10-CM | POA: Diagnosis present

## 2023-04-20 DIAGNOSIS — F17210 Nicotine dependence, cigarettes, uncomplicated: Secondary | ICD-10-CM | POA: Diagnosis present

## 2023-04-20 DIAGNOSIS — R569 Unspecified convulsions: Secondary | ICD-10-CM | POA: Diagnosis present

## 2023-04-20 DIAGNOSIS — D62 Acute posthemorrhagic anemia: Secondary | ICD-10-CM | POA: Diagnosis not present

## 2023-04-20 DIAGNOSIS — E876 Hypokalemia: Secondary | ICD-10-CM | POA: Diagnosis not present

## 2023-04-20 DIAGNOSIS — S7292XA Unspecified fracture of left femur, initial encounter for closed fracture: Secondary | ICD-10-CM | POA: Diagnosis present

## 2023-04-20 DIAGNOSIS — S72002A Fracture of unspecified part of neck of left femur, initial encounter for closed fracture: Secondary | ICD-10-CM | POA: Diagnosis present

## 2023-04-20 DIAGNOSIS — F419 Anxiety disorder, unspecified: Secondary | ICD-10-CM | POA: Diagnosis present

## 2023-04-20 DIAGNOSIS — Z6820 Body mass index (BMI) 20.0-20.9, adult: Secondary | ICD-10-CM

## 2023-04-20 DIAGNOSIS — D75839 Thrombocytosis, unspecified: Secondary | ICD-10-CM | POA: Diagnosis not present

## 2023-04-20 LAB — CBC WITH DIFFERENTIAL/PLATELET
Abs Immature Granulocytes: 0.1 10*3/uL — ABNORMAL HIGH (ref 0.00–0.07)
Basophils Absolute: 0.1 10*3/uL (ref 0.0–0.1)
Basophils Relative: 1 %
Eosinophils Absolute: 0.1 10*3/uL (ref 0.0–0.5)
Eosinophils Relative: 1 %
HCT: 37 % (ref 36.0–46.0)
Hemoglobin: 12.6 g/dL (ref 12.0–15.0)
Immature Granulocytes: 1 %
Lymphocytes Relative: 18 %
Lymphs Abs: 2.5 10*3/uL (ref 0.7–4.0)
MCH: 29.6 pg (ref 26.0–34.0)
MCHC: 34.1 g/dL (ref 30.0–36.0)
MCV: 87.1 fL (ref 80.0–100.0)
Monocytes Absolute: 0.5 10*3/uL (ref 0.1–1.0)
Monocytes Relative: 3 %
Neutro Abs: 11 10*3/uL — ABNORMAL HIGH (ref 1.7–7.7)
Neutrophils Relative %: 76 %
Platelets: 577 10*3/uL — ABNORMAL HIGH (ref 150–400)
RBC: 4.25 MIL/uL (ref 3.87–5.11)
RDW: 15.2 % (ref 11.5–15.5)
WBC: 14.2 10*3/uL — ABNORMAL HIGH (ref 4.0–10.5)
nRBC: 0 % (ref 0.0–0.2)

## 2023-04-20 LAB — BASIC METABOLIC PANEL
Anion gap: 12 (ref 5–15)
BUN: 8 mg/dL (ref 6–20)
CO2: 21 mmol/L — ABNORMAL LOW (ref 22–32)
Calcium: 8.7 mg/dL — ABNORMAL LOW (ref 8.9–10.3)
Chloride: 101 mmol/L (ref 98–111)
Creatinine, Ser: 0.57 mg/dL (ref 0.44–1.00)
GFR, Estimated: >60 mL/min (ref >60)
Glucose, Bld: 101 mg/dL — ABNORMAL HIGH (ref 70–99)
Potassium: 2.9 mmol/L — ABNORMAL LOW (ref 3.5–5.1)
Sodium: 134 mmol/L — ABNORMAL LOW (ref 135–145)

## 2023-04-20 LAB — PROTIME-INR
INR: 1.1 (ref 0.8–1.2)
Prothrombin Time: 14.2 s (ref 11.4–15.2)

## 2023-04-20 LAB — TYPE AND SCREEN
ABO/RH(D): B POS
Antibody Screen: NEGATIVE

## 2023-04-20 LAB — HIV ANTIBODY (ROUTINE TESTING W REFLEX): HIV Screen 4th Generation wRfx: NONREACTIVE

## 2023-04-20 MED ORDER — HYDROCODONE-ACETAMINOPHEN 5-325 MG PO TABS
1.0000 | ORAL_TABLET | Freq: Four times a day (QID) | ORAL | Status: DC | PRN
  Administered 2023-04-21 (×3): 2 via ORAL
  Filled 2023-04-20 (×3): qty 2

## 2023-04-20 MED ORDER — MORPHINE SULFATE (PF) 2 MG/ML IV SOLN
2.0000 mg | INTRAVENOUS | Status: DC | PRN
  Administered 2023-04-20 – 2023-04-22 (×3): 2 mg via INTRAVENOUS
  Filled 2023-04-20 (×3): qty 1

## 2023-04-20 MED ORDER — BISACODYL 5 MG PO TBEC: 5.0000 mg | DELAYED_RELEASE_TABLET | Freq: Every day | ORAL | Status: DC | PRN

## 2023-04-20 MED ORDER — HEPARIN SODIUM (PORCINE) 5000 UNIT/ML IJ SOLN
5000.0000 [IU] | Freq: Three times a day (TID) | INTRAMUSCULAR | Status: AC
  Administered 2023-04-20 – 2023-04-21 (×3): 5000 [IU] via SUBCUTANEOUS
  Filled 2023-04-20 (×3): qty 1

## 2023-04-20 MED ORDER — ALBUTEROL SULFATE (2.5 MG/3ML) 0.083% IN NEBU: 2.5000 mg | INHALATION_SOLUTION | RESPIRATORY_TRACT | Status: DC | PRN

## 2023-04-20 MED ORDER — METHOCARBAMOL 1000 MG/10ML IJ SOLN: 500.0000 mg | Freq: Four times a day (QID) | INTRAMUSCULAR | Status: DC | PRN

## 2023-04-20 MED ORDER — LACTATED RINGERS IV SOLN: INTRAVENOUS | Status: AC

## 2023-04-20 MED ORDER — PANTOPRAZOLE SODIUM 40 MG IV SOLR
40.0000 mg | INTRAVENOUS | Status: DC
  Administered 2023-04-20: 40 mg via INTRAVENOUS
  Filled 2023-04-20: qty 10

## 2023-04-20 MED ORDER — MUPIROCIN 2 % EX OINT
1.0000 | TOPICAL_OINTMENT | Freq: Two times a day (BID) | CUTANEOUS | Status: DC
  Administered 2023-04-20 – 2023-04-24 (×8): 1 via NASAL
  Filled 2023-04-20 (×3): qty 22

## 2023-04-20 MED ORDER — FENTANYL CITRATE PF 50 MCG/ML IJ SOSY
50.0000 ug | PREFILLED_SYRINGE | Freq: Once | INTRAMUSCULAR | Status: AC
  Administered 2023-04-20: 50 ug via INTRAVENOUS
  Filled 2023-04-20: qty 1

## 2023-04-20 MED ORDER — METHOCARBAMOL 500 MG PO TABS
500.0000 mg | ORAL_TABLET | Freq: Four times a day (QID) | ORAL | Status: DC | PRN
  Administered 2023-04-20 – 2023-04-21 (×4): 500 mg via ORAL
  Filled 2023-04-20 (×4): qty 1

## 2023-04-20 NOTE — ED Provider Triage Note (Signed)
 Emergency Medicine Provider Triage Evaluation Note  [name] , a 63 y.o. female  was evaluated in triage.  Pt complains of hip pain.  Review of Systems  Positive:  Negative:   Physical Exam  BP 133/86   Pulse (!) 117   Temp (!) 97.5 F (36.4 C)   Resp 20   Ht 5\' 2"  (1.575 m)   Wt 49.4 kg   SpO2 98%   BMI 19.94 kg/m  Gen:   Awake, no distress   Resp:  Normal effort  MSK:   Moves extremities without difficulty  Other:    Medical Decision Making  Medically screening exam initiated at 1:38 PM.  Appropriate orders placed.  [name] was informed that the remainder of the evaluation will be completed by another provider, this initial triage assessment does not replace that evaluation, and the importance of remaining in the ED until their evaluation is complete.  Left hip pain for many weeks. Seen in ED last week.   [name], [place] 04/20/23 1338

## 2023-04-20 NOTE — ED Notes (Signed)
 PA at bedside.

## 2023-04-20 NOTE — Consult Note (Signed)
 Reason for Consult:Left hip fx Referring Physician: Eber Hong Time called: 1458 Time at bedside: 1509   [name] is an 63 y.o. female.  HPI: [name] fell about 3 weeks ago on the 21st when she slipped on some slippery stairs. She had immediate left hip pain but was able to get up. She has managed to get around with the help of a cane. She was seen at [facility] and dx with sciatica on 3/4 (not imaged). As she has failed to improve with conservative measures she returned to the ED where x-rays showed a hip fx and orthopedic surgery was consulted. She lives at home alone and did not use any assistive devices to ambulate. She works as a Production assistant, radio.  Past Medical History:  Diagnosis Date   Arthritis    Depression    Seizures (HCC)    2 seizures, last in April    Past Surgical History:  Procedure Laterality Date   c section      History reviewed. No pertinent family history.  Social History:  reports that she has been smoking cigarettes. She has a 45 pack-year smoking history. She has never used smokeless tobacco. She reports current alcohol use of about 12.0 standard drinks of alcohol per week. She reports that she does not use drugs.  Allergies:  Allergies  Allergen Reactions   Seroquel [Quetiapine Fumarate] Anaphylaxis    Pt reports that it made her throat close   Sulfa Antibiotics Anaphylaxis    Medications: I have reviewed the patient's current medications.  Results for orders placed or performed during the hospital encounter of 04/20/23 (from the past 48 hours)  CBC with Differential     Status: Abnormal   Collection Time: 04/20/23  3:09 PM  Result Value Ref Range   WBC 14.2 (H) 4.0 - 10.5 K/uL   RBC 4.25 3.87 - 5.11 MIL/uL   Hemoglobin 12.6 12.0 - 15.0 g/dL   HCT 08.6 57.8 - 46.9 %   MCV 87.1 80.0 - 100.0 fL   MCH 29.6 26.0 - 34.0 pg   MCHC 34.1 30.0 - 36.0 g/dL   RDW 62.9 52.8 - 41.3 %   Platelets 577 (H) 150 - 400 K/uL   nRBC 0.0 0.0 - 0.2 %   Neutrophils Relative % 76  %   Neutro Abs 11.0 (H) 1.7 - 7.7 K/uL   Lymphocytes Relative 18 %   Lymphs Abs 2.5 0.7 - 4.0 K/uL   Monocytes Relative 3 %   Monocytes Absolute 0.5 0.1 - 1.0 K/uL   Eosinophils Relative 1 %   Eosinophils Absolute 0.1 0.0 - 0.5 K/uL   Basophils Relative 1 %   Basophils Absolute 0.1 0.0 - 0.1 K/uL   Immature Granulocytes 1 %   Abs Immature Granulocytes 0.10 (H) 0.00 - 0.07 K/uL    Comment: Performed at [facility], [address] [place], [place] [address]  Basic metabolic panel     Status: Abnormal   Collection Time: 04/20/23  3:09 PM  Result Value Ref Range   Sodium 134 (L) 135 - 145 mmol/L   Potassium 2.9 (L) 3.5 - 5.1 mmol/L   Chloride 101 98 - 111 mmol/L   CO2 21 (L) 22 - 32 mmol/L   Glucose, Bld 101 (H) 70 - 99 mg/dL    Comment: Glucose reference range applies only to samples taken after fasting for at least 8 hours.   BUN 8 6 - 20 mg/dL   Creatinine, Ser 0.27 0.44 - 1.00  mg/dL   Calcium 8.7 (L) 8.9 - 10.3 mg/dL   GFR, Estimated >40 >98 mL/min    Comment: (NOTE) Calculated using the CKD-EPI Creatinine Equation (2021)    Anion gap 12 5 - 15    Comment: Performed at [facility], [address] [place], [place] [address]  Protime-INR     Status: None   Collection Time: 04/20/23  3:09 PM  Result Value Ref Range   Prothrombin Time 14.2 11.4 - 15.2 seconds   INR 1.1 0.8 - 1.2    Comment: (NOTE) INR goal varies based on device and disease states. Performed at [facility], [address], [place], [place] [address]     No results found.  Review of Systems  HENT:  Negative for ear discharge, ear pain, hearing loss and tinnitus.   Eyes:  Negative for photophobia and pain.  Respiratory:  Negative for cough and shortness of breath.   Cardiovascular:  Negative for chest pain.  Gastrointestinal:  Negative for abdominal pain, nausea and vomiting.  Genitourinary:  Negative for dysuria, flank pain, frequency and urgency.  Musculoskeletal:  Positive for  arthralgias (Left hip). Negative for back pain, myalgias and neck pain.  Neurological:  Negative for dizziness and headaches.  Hematological:  Does not bruise/bleed easily.  Psychiatric/Behavioral:  The patient is not nervous/anxious.    Blood pressure 128/80, pulse 84, temperature 97.7 F (36.5 C), resp. rate 18, height 5\' 2"  (1.575 m), weight 49.4 kg, SpO2 99%. Physical Exam Constitutional:      General: She is not in acute distress.    Appearance: She is well-developed. She is not diaphoretic.  HENT:     Head: Normocephalic and atraumatic.  Eyes:     General: No scleral icterus.       Right eye: No discharge.        Left eye: No discharge.     Conjunctiva/sclera: Conjunctivae normal.  Cardiovascular:     Rate and Rhythm: Normal rate and regular rhythm.  Pulmonary:     Effort: Pulmonary effort is normal. No respiratory distress.  Musculoskeletal:     Cervical back: Normal range of motion.     Comments: LLE No traumatic wounds, ecchymosis, or rash  Mod TTP hip  No knee or ankle effusion  Knee stable to varus/ valgus and anterior/posterior stress  Sens SPN, TN intact, DPN mildly paresthetic  Motor EHL, ext, flex, evers 5/5  DP 2+, PT 2+, No significant edema  Skin:    General: Skin is warm and dry.  Neurological:     Mental Status: She is alert.  Psychiatric:        Mood and Affect: Mood normal.        Behavior: Behavior normal.     Assessment/Plan: Left hip fx -- Plan THA Thursday with Dr. Roda Shutters. Please keep NPO after MN tomorrow.    Freeman Caldron, PA-C Orthopedic Surgery [phone] 04/20/2023, 4:10 PM

## 2023-04-20 NOTE — ED Triage Notes (Signed)
 Patient reports left anterior hip pain and numbness in her foot since a fall in February. Patient seen at [facility] for same and did the exercises she was given. Patient reports the exercises helped at the time but remains unable to bear weight.

## 2023-04-20 NOTE — Plan of Care (Signed)

## 2023-04-20 NOTE — H&P (Signed)
 History and Physical    Patient: [name] ONG:[id number] DOB: [birth year] DOA: 04/20/2023 DOS: the patient was seen and examined on 04/20/2023 PCP: Patient, No Pcp Per  Patient coming from: Home Chief complaint: Chief Complaint  Patient presents with   Hip Pain   HPI:  [name] is a 63 y.o. female with past medical history  of   Tobacco abuse and allergies to Seroquel and sulfa antibiotics, was seen for a fall on Apr 02 2023 Her initial fall was described as mechanical fall on a muddy driveway during the poor weather that we had a month ago.  She landed on her left hip and has had pain in that hip ever since. and left hip pain she was out of work for about a week and then returned intermittently with trouble standing and walking and turning due to pain patient did use a cane to help her through the work duties.  She did start using Goody powders and Aleve.  She had been having radiating pain since her fall.  Pain was relieved by NSAIDs.   Since then she has been having pain in her left hip.  Patient initially went to emergency department where she was evaluated and felt it was musculoskeletal or sciatica related and discharged with OTC regimen.  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  Today however she was not able to bear weight or walk on her left leg is when she decided to come to the hospital.   >>ED Course: Pt in ed is alert awake oriented comfortable. >>Vital signs in the ED were notable for the following:  Vitals:   04/20/23 1146 04/20/23 1327 04/20/23 1453  BP: 133/86  128/80  Pulse: (!) 117  84  Temp: (!) 97.5 F (36.4 C)  97.7 F (36.5 C)  Resp: 20  18  Height:  5\' 2"  (1.575 m)   Weight:  49.4 kg   SpO2: 98%  99%  BMI (Calculated):  19.93    >>Labs were notable for the following: CMP shows sodium 134 potassium 2.9 bicarb 21 glucose 101 creatinine 0.57. CBC shows  leukocytosis of 14.2 normal hemoglobin and platelets of 577.  >>EKG: Independently reviewed: EKG shows normal sinus rhythm at 89 with PR of 166, anteroseptal Q waves concerning for history of infarct.  QTc of 442  >>Imaging and additional notable ED work-up:  Left hip x-ray shows an impacted midcervical left femur fracture. Chest x-ray shows no acute cardiopulmonary process.  >>While in the ED patient received the following: Medications  albuterol (VENTOLIN HFA) 108 (90 Base) MCG/ACT inhaler 2 puff (has no administration in time range)  pantoprazole (PROTONIX) injection 40 mg (has no administration in time range)  fentaNYL (SUBLIMAZE) injection 50 mcg (50 mcg Intravenous Given 04/20/23 1508)   Review of Systems  Musculoskeletal:  Positive for falls and joint pain.  All other systems reviewed and are negative.  Past Medical History:  Diagnosis Date   Arthritis    Depression    Seizures (HCC)    2 seizures, last in April   Past Surgical History:  Procedure Laterality Date   c section      reports that she has been smoking cigarettes. She has a 45 pack-year smoking history. She has never used smokeless tobacco. She reports current alcohol use of about 12.0 standard drinks of alcohol per week. She reports that she does not use  drugs.  Allergies  Allergen Reactions   Seroquel [Quetiapine Fumarate] Anaphylaxis    Pt reports that it made her throat close   Sulfa Antibiotics Anaphylaxis    History reviewed. No pertinent family history.  Prior to Admission medications   Medication Sig Start Date End Date Taking? Authorizing Provider  albuterol (PROVENTIL HFA;VENTOLIN HFA) 108 (90 BASE) MCG/ACT inhaler Inhale 2 puffs into the lungs every 4 (four) hours as needed for wheezing or shortness of breath. 10/04/14   Everlene Farrier, PA-C  cetirizine (ZYRTEC ALLERGY) 10 MG tablet Take 1 tablet (10 mg total) by mouth daily. 10/04/14   Everlene Farrier, PA-C  DM-Doxylamine-Acetaminophen (NYQUIL  COLD & FLU PO) Take 1 capsule by mouth at bedtime as needed (flu-like symptoms).    [provider]  fluticasone (FLONASE) 50 MCG/ACT nasal spray Place 2 sprays into both nostrils daily. 10/04/14   Everlene Farrier, PA-C  guaiFENesin (ROBITUSSIN) 100 MG/5ML liquid Take 5-10 mLs (100-200 mg total) by mouth every 4 (four) hours as needed for cough. 02/14/13   Toy Cookey, MD  guaifenesin (ROBITUSSIN) 100 MG/5ML syrup Take 200 mg by mouth 3 (three) times daily as needed for cough (COUGH).    [provider]  naproxen (NAPROSYN) 500 MG tablet Take 1 tablet (500 mg total) by mouth 2 (two) times daily. 10/19/17   Mardella Layman, MD  pseudoephedrine (SUDAFED 12 HOUR) 120 MG 12 hr tablet Take 120 mg by mouth every 12 (twelve) hours as needed for congestion (CONGESTION).    [provider]  Pseudoephedrine-APAP-DM (DAYQUIL MULTI-SYMPTOM COLD/FLU PO) Take 1 capsule by mouth 2 (two) times daily as needed (FLU-LIKE SYMPTOMS).    [provider]                                                                                   Vitals:   04/20/23 1146 04/20/23 1327 04/20/23 1453  BP: 133/86  128/80  Pulse: (!) 117  84  Resp: 20  18  Temp: (!) 97.5 F (36.4 C)  97.7 F (36.5 C)  SpO2: 98%  99%  Weight:  49.4 kg   Height:  5\' 2"  (1.575 m)    Physical Exam Vitals and nursing note reviewed.  Constitutional:      General: She is not in acute distress. HENT:     Head: Normocephalic and atraumatic.     Right Ear: Hearing normal.     Left Ear: Hearing normal.     Nose: Nose normal. No nasal deformity.     Mouth/Throat:     Lips: Pink.     Tongue: No lesions.     Pharynx: Oropharynx is clear.  Eyes:     General: Lids are normal.     Extraocular Movements: Extraocular movements intact.  Cardiovascular:     Rate and Rhythm: Normal rate and regular rhythm.     Heart sounds: Normal heart sounds.  Pulmonary:     Effort: Pulmonary effort is normal.     Breath sounds:  Normal breath sounds.  Abdominal:     General: Bowel sounds are normal. There is no distension.     Palpations: Abdomen is soft. There is no mass.  Tenderness: There is no abdominal tenderness.  Musculoskeletal:        General: No swelling or deformity.     Right lower leg: No edema.     Left lower leg: No edema.  Skin:    General: Skin is warm.  Neurological:     General: No focal deficit present.     Mental Status: She is alert and oriented to person, place, and time.     Cranial Nerves: Cranial nerves 2-12 are intact.  Psychiatric:        Attention and Perception: Attention normal.        Mood and Affect: Mood normal.        Speech: Speech normal.        Behavior: Behavior normal. Behavior is cooperative.      Labs on Admission: I have personally reviewed following labs and imaging studies  CBC: Recent Labs  Lab 04/20/23 1509  WBC 14.2*  NEUTROABS 11.0*  HGB 12.6  HCT 37.0  MCV 87.1  PLT 577*   Basic Metabolic Panel: Recent Labs  Lab 04/20/23 1509  NA 134*  K 2.9*  CL 101  CO2 21*  GLUCOSE 101*  BUN 8  CREATININE 0.57  CALCIUM 8.7*   GFR: Estimated Creatinine Clearance: 58.3 mL/min (by C-G formula based on SCr of 0.57 mg/dL). Liver Function Tests: No results for input(s): "AST", "ALT", "ALKPHOS", "BILITOT", "PROT", "ALBUMIN" in the last 168 hours. No results for input(s): "LIPASE", "AMYLASE" in the last 168 hours. No results for input(s): "AMMONIA" in the last 168 hours. Coagulation Profile: Recent Labs  Lab 04/20/23 1509  INR 1.1   Cardiac Enzymes: No results for input(s): "CKTOTAL", "CKMB", "CKMBINDEX", "TROPONINI" in the last 168 hours. BNP (last 3 results) No results for input(s): "PROBNP" in the last 8760 hours. HbA1C: No results for input(s): "HGBA1C" in the last 72 hours. CBG: No results for input(s): "GLUCAP" in the last 168 hours. Lipid Profile: No results for input(s): "CHOL", "HDL", "LDLCALC", "TRIG", "CHOLHDL", "LDLDIRECT" in the  last 72 hours. Thyroid Function Tests: No results for input(s): "TSH", "T4TOTAL", "FREET4", "T3FREE", "THYROIDAB" in the last 72 hours. Anemia Panel: No results for input(s): "VITAMINB12", "FOLATE", "FERRITIN", "TIBC", "IRON", "RETICCTPCT" in the last 72 hours. Urine analysis:    Component Value Date/Time   COLORURINE YELLOW 10/11/2011 1816   APPEARANCEUR CLOUDY (A) 10/11/2011 1816   LABSPEC 1.025 10/11/2011 1816   PHURINE 6.0 10/11/2011 1816   GLUCOSEU NEGATIVE 10/11/2011 1816   HGBUR TRACE (A) 10/11/2011 1816   BILIRUBINUR NEGATIVE 10/11/2011 1816   KETONESUR NEGATIVE 10/11/2011 1816   PROTEINUR NEGATIVE 10/11/2011 1816   UROBILINOGEN 0.2 10/11/2011 1816   NITRITE NEGATIVE 10/11/2011 1816   LEUKOCYTESUR SMALL (A) 10/11/2011 1816   Radiological Exams on Admission: DG Chest 1 View Result Date: 04/20/2023 CLINICAL DATA:  Hip fracture, preop EXAM: CHEST  1 VIEW COMPARISON:  None Available. FINDINGS: Lungs are clear. Heart size and mediastinal contours are within normal limits. Heavy mitral annulus calcifications. Aortic Atherosclerosis (ICD10-170.0). No effusion. Visualized bones unremarkable. IMPRESSION: 1. No acute cardiopulmonary disease. Electronically Signed   By: Corlis Leak M.D.   On: 04/20/2023 16:44   DG Hip Unilat W or Wo Pelvis 2-3 Views Left Result Date: 04/20/2023 CLINICAL DATA:  pain EXAM: DG HIP (WITH OR WITHOUT PELVIS) 2-3V LEFT COMPARISON:  CT 10/11/2011 FINDINGS: Impacted mid cervical left femur fracture, new since previous. No dislocation. Bony pelvis intact. Mild spondylitic changes in the visualized lower lumbar spine. IMPRESSION: Impacted mid cervical  left femur fracture. Electronically Signed   By: Corlis Leak M.D.   On: 04/20/2023 16:44   Data Reviewed: Relevant notes from primary care and specialist visits, past discharge summaries as available in EHR, including Care Everywhere. Prior diagnostic testing as pertinent to current admission diagnoses, Updated  medications and problem lists for reconciliation ED course, including vitals, labs, imaging, treatment and response to treatment,Triage notes, nursing and pharmacy notes and ED provider's notes Notable results as noted in HPI.Discussed case with EDMD/ ED APP/ or Specialty MD on call and as needed. Assessment & Plan  >> Fall: Mechanical fall, fall precautions.  >> Left femur fracture: Admit to [facility], orthopedic consulted by EDMD- Dale Marlton- Dr.Xu and pt has a planned sx on Thursday - Total hip arthroplasty  and npo after MN on Wednesday night.  Heparin q8  x 3 dose.  Pain control per hip fracture protocol. Pt is low risk for cardaic complications. We will start pt on incentive spirometry for next 24 hours prior to surgery that she may continue post op.   >>Leucocytosis: Most likely due to her hip fracture and reactive. Pt is also a smoker.    >>Tobacco Abuse: Nicotine patch.     DVT prophylaxis:  Heparin  Consults:  Orthopedics.  Advance Care Planning:    Code Status: Full Code   Family Communication:  None Disposition Plan:  Home Severity of Illness: The appropriate patient status for this patient is INPATIENT. Inpatient status is judged to be reasonable and necessary in order to provide the required intensity of service to ensure the patient's safety. The patient's presenting symptoms, physical exam findings, and initial radiographic and laboratory data in the context of their chronic comorbidities is felt to place them at high risk for further clinical deterioration. Furthermore, it is not anticipated that the patient will be medically stable for discharge from the hospital within 2 midnights of admission.   * I certify that at the point of admission it is my clinical judgment that the patient will require inpatient hospital care spanning beyond 2 midnights from the point of admission due to high intensity of service, high risk for further deterioration and high  frequency of surveillance required.*  Author: Gertha Calkin, MD 04/20/2023 5:16 PM  For on call review [email or web address].   Orders Placed This Encounter  Procedures   DG Hip Unilat W or Wo Pelvis 2-3 Views Left   DG Chest 1 View   CBC with Differential   Basic metabolic panel   Protime-INR   HIV Antibody (routine testing w rflx)   Diet NPO time specified   SCDs   Vital signs every hour x 4, then every 4 hours   Neurovascular checks every hour x 4, then every 4 hours   Bed with an overhead patient helper or overhead frame with a trapeze bar   Apply ice to affected area   Elevate heels off of bed   Turn cough deep breathe   Incentive spirometry   Bed rest with HOB to 30 degrees   Intake and output   If diabetic or glucose greater than 140mg /dl, notify physician to place Gylcemic Control (SSI) Order Set   Initiate Oral Care Protocol   Initiate Carrier Fluid Protocol   Document Pasero Opioid-Induced Sedation Scale (POSS) per protocol (see sidebar report)   Full code   Consult to orthopedic surgery   Consult to hospitalist   Consult to Transition of Care Team   Consult to Registered Dietitian  Oxygen therapy Mode or (Route): Nasal cannula; Liters Per Minute: 2   Pulse oximetry, continuous   Pulse oximetry, every 4 hours   Incentive spirometry RT   EKG 12-Lead   Type and screen   Saline lock IV   Admit to Inpatient (patient's expected length of stay will be greater than 2 midnights or inpatient only procedure)

## 2023-04-20 NOTE — ED Provider Notes (Signed)
 [place] EMERGENCY DEPARTMENT AT [facility] Provider Note   CSN: [id number] Arrival date & time: 04/20/23  1144     History  Chief Complaint  Patient presents with   Hip Pain    [name] is a 63 y.o. female.   Hip Pain   This patient is a very pleasant 63 year old female, she reports a history of prior clavicular injuries but no other bony injuries, she denies having high blood pressure or diabetes, she denies being an alcoholic though she does partake, she does smoke cigarettes and she works as a Theatre stage manager at [employer].  She has had to walk with a cane for the last 3 weeks after she had a mechanical fall on a muddy driveway during the poor weather that we had a month ago.  She landed on her left hip and has had pain in that hip ever since.  She was able to drive her self to the hospital today but with great pain and can only ambulate with a cane keeping her weight almost entirely off of her left leg.  She denies chest pain shortness of breath headache and has no numbness or weakness of the arms or the legs.  She has been using Goody powders to help with the pain    Home Medications Prior to Admission medications   Medication Sig Start Date End Date Taking? Authorizing Provider  albuterol (PROVENTIL HFA;VENTOLIN HFA) 108 (90 BASE) MCG/ACT inhaler Inhale 2 puffs into the lungs every 4 (four) hours as needed for wheezing or shortness of breath. 10/04/14   Everlene Farrier, PA-C  cetirizine (ZYRTEC ALLERGY) 10 MG tablet Take 1 tablet (10 mg total) by mouth daily. 10/04/14   Everlene Farrier, PA-C  DM-Doxylamine-Acetaminophen (NYQUIL COLD & FLU PO) Take 1 capsule by mouth at bedtime as needed (flu-like symptoms).    [provider]  fluticasone (FLONASE) 50 MCG/ACT nasal spray Place 2 sprays into both nostrils daily. 10/04/14   Everlene Farrier, PA-C  guaiFENesin (ROBITUSSIN) 100 MG/5ML liquid Take 5-10 mLs (100-200 mg total) by mouth every 4 (four) hours as needed for  cough. 02/14/13   Toy Cookey, MD  guaifenesin (ROBITUSSIN) 100 MG/5ML syrup Take 200 mg by mouth 3 (three) times daily as needed for cough (COUGH).    [provider]  naproxen (NAPROSYN) 500 MG tablet Take 1 tablet (500 mg total) by mouth 2 (two) times daily. 10/19/17   Mardella Layman, MD  pseudoephedrine (SUDAFED 12 HOUR) 120 MG 12 hr tablet Take 120 mg by mouth every 12 (twelve) hours as needed for congestion (CONGESTION).    [provider]  Pseudoephedrine-APAP-DM (DAYQUIL MULTI-SYMPTOM COLD/FLU PO) Take 1 capsule by mouth 2 (two) times daily as needed (FLU-LIKE SYMPTOMS).    [provider]      Allergies    Seroquel [quetiapine fumarate] and Sulfa antibiotics    Review of Systems   Review of Systems  All other systems reviewed and are negative.   Physical Exam Updated Vital Signs BP 128/80   Pulse 84   Temp 97.7 F (36.5 C)   Resp 18   Ht 1.575 m (5\' 2" )   Wt 49.4 kg   SpO2 99%   BMI 19.94 kg/m  Physical Exam Vitals and nursing note reviewed.  Constitutional:      General: She is not in acute distress.    Appearance: She is well-developed.  HENT:     Head: Normocephalic and atraumatic.     Mouth/Throat:  Pharynx: No oropharyngeal exudate.  Eyes:     General: No scleral icterus.       Right eye: No discharge.        Left eye: No discharge.     Conjunctiva/sclera: Conjunctivae normal.     Pupils: Pupils are equal, round, and reactive to light.  Neck:     Thyroid: No thyromegaly.     Vascular: No JVD.  Cardiovascular:     Rate and Rhythm: Regular rhythm. Tachycardia present.     Heart sounds: Normal heart sounds. No murmur heard.    No friction rub. No gallop.  Pulmonary:     Effort: Pulmonary effort is normal. No respiratory distress.     Breath sounds: Normal breath sounds. No wheezing or rales.  Abdominal:     General: Bowel sounds are normal. There is no distension.     Palpations: Abdomen is soft. There is no mass.      Tenderness: There is no abdominal tenderness.  Musculoskeletal:        General: Tenderness present. Normal range of motion.     Cervical back: Normal range of motion and neck supple.     Right lower leg: No edema.     Left lower leg: No edema.     Comments: No leg length discrepancy is visualized, the patient has pain with internal and external rotation of the hip, she can only straight leg raise a couple of inches before she gets severe pain.  She has soft compartments in the bilateral thighs.  She has symmetrical sensation of the bilateral lower extremities.  The knee and ankle joints bilaterally are normal.  Lymphadenopathy:     Cervical: No cervical adenopathy.  Skin:    General: Skin is warm and dry.     Findings: No erythema or rash.  Neurological:     Mental Status: She is alert.     Coordination: Coordination normal.  Psychiatric:        Behavior: Behavior normal.     ED Results / Procedures / Treatments   Labs (all labs ordered are listed, but only abnormal results are displayed) Labs Reviewed  CBC WITH DIFFERENTIAL/PLATELET - Abnormal; Notable for the following components:      Result Value   WBC 14.2 (*)    Platelets 577 (*)    Neutro Abs 11.0 (*)    Abs Immature Granulocytes 0.10 (*)    All other components within normal limits  BASIC METABOLIC PANEL - Abnormal; Notable for the following components:   Sodium 134 (*)    Potassium 2.9 (*)    CO2 21 (*)    Glucose, Bld 101 (*)    Calcium 8.7 (*)    All other components within normal limits  PROTIME-INR  TYPE AND SCREEN    EKG None  Radiology No results found.  Procedures Procedures    Medications Ordered in ED Medications  fentaNYL (SUBLIMAZE) injection 50 mcg (50 mcg Intravenous Given 04/20/23 1508)    ED Course/ Medical Decision Making/ A&P                                 Medical Decision Making Amount and/or Complexity of Data Reviewed Labs: ordered. Radiology: ordered. ECG/medicine tests:  ordered.  Risk Prescription drug management. Decision regarding hospitalization.    This patient presents to the ED for concern of injury to the hip, this involves an extensive number of treatment options,  and is a complaint that carries with it a high risk of complications and morbidity.  The differential diagnosis includes contusion, fracture, sciatica, pinched nerve, pelvic fracture or injury   Co morbidities that complicate the patient evaluation  Tobacco use   Additional history obtained:  Additional history obtained from medical record External records from outside source obtained and reviewed including prior visits, this patient was admitted to the hospital for depression over 10 years ago, since that time she has had only minor visits, she has does not have a family doctor   Lab Tests:  I Ordered, and personally interpreted labs.  The pertinent results include: Leukocytosis of 14,000, metabolic panel reassuring, INR normal   Imaging Studies ordered:  I ordered imaging studies including chest x-ray seen and no acute findings, hip fracture found on the left sided hip I independently visualized and interpreted imaging which showed hip fracture I agree with the radiologist interpretation   Cardiac Monitoring: / EKG:  The patient was maintained on a cardiac monitor.  I personally viewed and interpreted the cardiac monitored which showed an underlying rhythm of: Sinus rhythm   Consultations Obtained:  I requested consultation with the EP just who wants the patient transferred to [facility] for surgery tomorrow,  and discussed lab and imaging findings as well as pertinent plan - they recommend: Admit to hospitalist Will discuss with hospitalist team for admission.   Problem List / ED Course / Critical interventions / Medication management  Hip fracture, pain meds given    Social Determinants of Health:  Tobacco use   Test / Admission - Considered:  Admit to  hospital         Final Clinical Impression(s) / ED Diagnoses Final diagnoses:  Closed fracture of left hip, initial encounter [facility])    Rx / DC Orders ED Discharge Orders     None         Eber Hong, MD 04/20/23 1554

## 2023-04-21 DIAGNOSIS — S72045A Nondisplaced fracture of base of neck of left femur, initial encounter for closed fracture: Secondary | ICD-10-CM

## 2023-04-21 DIAGNOSIS — E43 Unspecified severe protein-calorie malnutrition: Secondary | ICD-10-CM | POA: Insufficient documentation

## 2023-04-21 LAB — GLUCOSE, CAPILLARY: Glucose-Capillary: 100 mg/dL — ABNORMAL HIGH (ref 70–99)

## 2023-04-21 LAB — SURGICAL PCR SCREEN
MRSA, PCR: NEGATIVE
Staphylococcus aureus: NEGATIVE

## 2023-04-21 MED ORDER — POTASSIUM CHLORIDE CRYS ER 20 MEQ PO TBCR
40.0000 meq | EXTENDED_RELEASE_TABLET | Freq: Two times a day (BID) | ORAL | Status: AC
  Administered 2023-04-21 (×2): 40 meq via ORAL
  Filled 2023-04-21 (×2): qty 2

## 2023-04-21 MED ORDER — ENSURE ENLIVE PO LIQD
237.0000 mL | Freq: Two times a day (BID) | ORAL | Status: DC
Start: 2023-04-21 — End: 2023-04-24
  Administered 2023-04-21 – 2023-04-24 (×4): 237 mL via ORAL

## 2023-04-21 MED ORDER — PANTOPRAZOLE SODIUM 40 MG PO TBEC
40.0000 mg | DELAYED_RELEASE_TABLET | Freq: Two times a day (BID) | ORAL | Status: DC
  Administered 2023-04-21 – 2023-04-24 (×7): 40 mg via ORAL
  Filled 2023-04-21 (×7): qty 1

## 2023-04-21 MED ORDER — POLYETHYLENE GLYCOL 3350 17 G PO PACK
17.0000 g | PACK | Freq: Two times a day (BID) | ORAL | Status: AC
  Administered 2023-04-21 – 2023-04-22 (×3): 17 g via ORAL
  Filled 2023-04-21 (×3): qty 1

## 2023-04-21 NOTE — Discharge Instructions (Signed)
 Nutrition Post Hospital Stay Proper nutrition can help your body recover from illness and injury.   Foods and beverages high in protein, vitamins, and minerals help rebuild muscle loss, promote healing, & reduce fall risk.   In addition to eating healthy foods, a nutrition shake is an easy, delicious way to get the nutrition you need during and after your hospital stay  It is recommended that you continue to drink 2 bottles per day of: Ensure Plus for at least 1 month (30 days) after your hospital stay   Tips for adding a nutrition shake into your routine: As allowed, drink one with vitamins or medications instead of water or juice Enjoy one as a tasty mid-morning or afternoon snack Drink cold or make a milkshake out of it Drink one instead of milk with cereal or snacks Use as a coffee creamer   Available at the following grocery stores and pharmacies:           * Karin Golden * [employer] * [employer]co  * Rite Aid          * Walmart * Sam's Club  * Walgreens      * Target  * BJ's   * CVS  * Lowes Foods   * [facility] [phone]            For COUPONS visit: www.[email or web address]/join or [email or web address].br   Suggested Substitutions Ensure Plus = Boost Plus = Carnation Breakfast Essentials = Boost Compact Ensure Active Clear = Boost Breeze Glucerna Shake = Boost Glucose Control = Carnation Breakfast Essentials SUGAR FREE

## 2023-04-21 NOTE — Progress Notes (Incomplete)
   04/21/23 1637  TOC Brief Assessment  Insurance and Status Reviewed  Patient has primary care physician Yes  Home environment has been reviewed PTA independent with ADL's, no DME usage.  Prior level of function: Resides alone.  Prior/Current Home Services No current home services  Social Drivers of Health Review SDOH reviewed no interventions necessary  Readmission risk has been reviewed No  Transition of care needs transition of care needs identified, TOC will continue to follow

## 2023-04-21 NOTE — Progress Notes (Incomplete)
    04/21/23 1637  TOC Brief Assessment  Insurance and Status Reviewed  Patient has primary care physician No  Home environment has been reviewed PTA independent with ADL's, no DME usage.  Prior level of function: Resides alone. Family ( children Maricela Curet) support.  Prior/Current Home Services No current home services  Social Drivers of Health Review SDOH reviewed no interventions necessary  Readmission risk has been reviewed No  Transition of care needs transition of care needs identified, TOC will continue to follow   Gae Gallop

## 2023-04-21 NOTE — Plan of Care (Signed)

## 2023-04-21 NOTE — Progress Notes (Signed)
 [facility] PROGRESS NOTE    Progress Note  [name]  ONG:[id number] DOB: Jul 15, 1960 DOA: 04/20/2023 PCP: Patient, No Pcp Per     Brief Narrative:   [name] is an 63 y.o. female past medical history of tobacco abuse was seen in recently after an initial mechanical fall in the muddy driveway.  Has been taking NSAIDs at home, which she relates her pain has not improved.  Relates that today she was not able to bear weight, and show an impacted mid cervical left femur fracture BTK was consulted recommended surgical intervention on 04/22/2023.     Assessment/Plan:   Femur fracture, left [facility]) Orthopedic surgery was consulted recommended surgical intervention on 04/22/2023. N.p.o. after midnight continue narcotics for pain control.  Add Robaxin. Started on bowel regimen. Incentive spirometry postop. Anticoagulation and narcotics per orthopedic surgery  Hypokalemia: Replete orally recheck in the morning.  Leukocytosis: Likely reactive due to fracture.  Thrombocytosis Likely reactive.   DVT prophylaxis: lovenox Family Communication:none Status is: Inpatient Remains inpatient appropriate because: Acute left hip fracture    Code Status:     Code Status Orders  (From admission, onward)           Start     Ordered   04/20/23 1702  Full code  Continuous       Question:  By:  Answer:  Other   04/20/23 1707           Code Status History     This patient has a current code status but no historical code status.         IV Access:   Peripheral IV   Procedures and diagnostic studies:   DG Chest 1 View Result Date: 04/20/2023 CLINICAL DATA:  Hip fracture, preop EXAM: CHEST  1 VIEW COMPARISON:  None Available. FINDINGS: Lungs are clear. Heart size and mediastinal contours are within normal limits. Heavy mitral annulus calcifications. Aortic Atherosclerosis (ICD10-170.0). No effusion. Visualized bones unremarkable. IMPRESSION: 1. No acute  cardiopulmonary disease. Electronically Signed   By: Corlis Leak M.D.   On: 04/20/2023 16:44   DG Hip Unilat W or Wo Pelvis 2-3 Views Left Result Date: 04/20/2023 CLINICAL DATA:  pain EXAM: DG HIP (WITH OR WITHOUT PELVIS) 2-3V LEFT COMPARISON:  CT 10/11/2011 FINDINGS: Impacted mid cervical left femur fracture, new since previous. No dislocation. Bony pelvis intact. Mild spondylitic changes in the visualized lower lumbar spine. IMPRESSION: Impacted mid cervical left femur fracture. Electronically Signed   By: Corlis Leak M.D.   On: 04/20/2023 16:44     Medical Consultants:   None.   Subjective:    [name] relates she continues to have pain  Objective:    Vitals:   04/20/23 1938 04/20/23 2230 04/21/23 0016 04/21/23 0522  BP: 128/72 117/79 112/65 116/72  Pulse: 87 93 89 78  Resp: 18 16 16 16   Temp: 98 F (36.7 C) 98.4 F (36.9 C) 98.9 F (37.2 C) 98.2 F (36.8 C)  TempSrc: Oral     SpO2: 98% 99% 96% 97%  Weight:      Height:       SpO2: 97 %  No intake or output data in the 24 hours ending 04/21/23 0649 Filed Weights   04/20/23 1327  Weight: 49.4 kg    Exam: General exam: In no acute distress. Respiratory system: Good air movement and clear to auscultation. Cardiovascular system: S1 & S2 heard, RRR. No JVD. Gastrointestinal system: Abdomen is nondistended, soft and nontender.  Extremities:  External rotation of the left hip causes excruciating pain Skin: No rashes, lesions or ulcers Psychiatry: Judgement and insight appear normal. Mood & affect appropriate.    Data Reviewed:    Labs: Basic Metabolic Panel: Recent Labs  Lab 04/20/23 1509  NA 134*  K 2.9*  CL 101  CO2 21*  GLUCOSE 101*  BUN 8  CREATININE 0.57  CALCIUM 8.7*   GFR Estimated Creatinine Clearance: 58.3 mL/min (by C-G formula based on SCr of 0.57 mg/dL). Liver Function Tests: No results for input(s): "AST", "ALT", "ALKPHOS", "BILITOT", "PROT", "ALBUMIN" in the last 168 hours. No  results for input(s): "LIPASE", "AMYLASE" in the last 168 hours. No results for input(s): "AMMONIA" in the last 168 hours. Coagulation profile Recent Labs  Lab 04/20/23 1509  INR 1.1   COVID-19 Labs  No results for input(s): "DDIMER", "FERRITIN", "LDH", "CRP" in the last 72 hours.  No results found for: "SARSCOV2NAA"  CBC: Recent Labs  Lab 04/20/23 1509  WBC 14.2*  NEUTROABS 11.0*  HGB 12.6  HCT 37.0  MCV 87.1  PLT 577*   Cardiac Enzymes: No results for input(s): "CKTOTAL", "CKMB", "CKMBINDEX", "TROPONINI" in the last 168 hours. BNP (last 3 results) No results for input(s): "PROBNP" in the last 8760 hours. CBG: No results for input(s): "GLUCAP" in the last 168 hours. D-Dimer: No results for input(s): "DDIMER" in the last 72 hours. Hgb A1c: No results for input(s): "HGBA1C" in the last 72 hours. Lipid Profile: No results for input(s): "CHOL", "HDL", "LDLCALC", "TRIG", "CHOLHDL", "LDLDIRECT" in the last 72 hours. Thyroid function studies: No results for input(s): "TSH", "T4TOTAL", "T3FREE", "THYROIDAB" in the last 72 hours.  Invalid input(s): "FREET3" Anemia work up: No results for input(s): "VITAMINB12", "FOLATE", "FERRITIN", "TIBC", "IRON", "RETICCTPCT" in the last 72 hours. Sepsis Labs: Recent Labs  Lab 04/20/23 1509  WBC 14.2*   Microbiology Recent Results (from the past 240 hours)  Surgical PCR screen     Status: None   Collection Time: 04/20/23 12:15 AM   Specimen: Nasal Mucosa; Nasal Swab  Result Value Ref Range Status   MRSA, PCR NEGATIVE NEGATIVE Final   Staphylococcus aureus NEGATIVE NEGATIVE Final    Comment: (NOTE) The Xpert SA Assay (FDA approved for NASAL specimens in patients 30 years of age and older), is one component of a comprehensive surveillance program. It is not intended to diagnose infection nor to guide or monitor treatment. Performed at [facility], [address] [place], [place] [address]      Medications:     mupirocin ointment  1 Application Nasal BID   pantoprazole (PROTONIX) IV  40 mg Intravenous Q24H   Continuous Infusions:  lactated ringers 40 mL/hr at 04/20/23 2311      LOS: 1 day   Marinda Elk  [facility]  04/21/2023, 6:49 AM

## 2023-04-21 NOTE — Progress Notes (Addendum)
 Initial Nutrition Assessment  DOCUMENTATION CODES:  Severe malnutrition in context of social or environmental circumstances (mold exposure for 6 months, diarrhea from medications)  INTERVENTION:  Magic cup TID with meals, each supplement provides 290 kcal and 9 grams of protein  Ensure Enlive po BID, each supplement provides 350 kcal and 20 grams of protein.  Continue to eat meals to optimize nutrition prior to and after surgery to aid in recovery.  Added instructions for discharge for protein supplements and importance of protein for recovery  NUTRITION DIAGNOSIS:  Severe Malnutrition related to social / environmental circumstances (mold exposure for 6 months, diarrhea from medications) as evidenced by percent weight loss, severe muscle depletion, moderate fat depletion, energy intake < 75% for > or equal to 3 months.  GOAL:  Patient will meet greater than or equal to 90% of their needs  MONITOR:  PO intake, Supplement acceptance, Weight trends  REASON FOR ASSESSMENT:  Consult Assessment of nutrition requirement/status  ASSESSMENT:  Pt with hx pf seizures, arthritis, and depression admitted for L hip pain after fall 1 month ago (2/25). Plan for total hip arthroplasty for hip fx 3/14.  3/4 Treated for sciatica at [facility] and sent home 3/11 Admitted to [facility], x-ray shows hip fx  Plan for surgery 3/13 for total hip arthroplasty. Pt will be NPO after midnight Wednesday 3/12 to prepare for surgery 3/13.  Spoke with pt who was awake and alert. Pt reports appetite is low because she typically only eats 1 x per day, but reports she ate her breakfast. Pt reports no issues with n/v, diarrhea or constipation during admission. Pt reports she has no chewing or swallowing difficulties. Pt reports she has no issues with ordering meals.  PTA, pt reports she had a low appetite due to n/v and diarrhea. Pt reports in 11/2022 she found out the place she had been living in was filled with  mold and she believes mold was the cause of her problems. She reports she had been exposed to mold for at least 6 months. She reports any time she cooked at home or ate food that had been reheated in home, she would get sick. When she ate out of the house, she would not get sick. Pt also reports pain medication given to her recently was causing diarrhea which also affected appetite leading up to admission. Pt has since moved to a new house, but still continues to eat only 1x per day for the past few months. Pt reports since moving, she can eat meals at home without feeling sick. She reports eating seafood, chicken, vegetables, and tends to avoid fried, fatty foods. Pt drinks a lot of water most of the day and will drink some soda as well.   Discussed importance of protein to minimize muscle depletion and help in recovery from surgery. Discussed the importance of eating well before and after surgery to optimize recovery and how nutrition is important for the 6 weeks following surgery. Encouraged pt to increase intake to help meet calorie and protein needs. Pt agreeable to Ensure Enlive and magic cups while admitted.   Pt's low appetite and sickness led her to lose weight and believes she has lost 20# since 09/2022. Pt reports typical wt was around 125#. With pt report, pt has lost about 13% in 6.5 months which is clinically significant and aligns with findings of nutrition focused physical exam.  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 She reports managing her pain with medication and uses a walking stick when needed. Pt will need assistance after surgery and will work with PT/OT to get back to baseline.  Medications reviewed and include: protonix, miralax, potassium chloride  Labs reviewed: CBG 100, Potassium 2.9, Sodium 134, Calcium 8.7  NUTRITION - FOCUSED PHYSICAL EXAM:  Flowsheet Row Most Recent Value  Orbital Region Moderate depletion  Upper Arm Region  Moderate depletion  Thoracic and Lumbar Region Moderate depletion  Buccal Region Mild depletion  Temple Region Severe depletion  Clavicle Bone Region Moderate depletion  Clavicle and Acromion Bone Region Moderate depletion  Scapular Bone Region Severe depletion  Dorsal Hand Severe depletion  Patellar Region Severe depletion  Anterior Thigh Region Severe depletion  Posterior Calf Region Severe depletion  Edema (RD Assessment) None  Hair Reviewed  [pt reports hair falling out due to mold exposure]  Eyes Reviewed  Mouth Reviewed  Skin Reviewed  Nails Reviewed   Diet Order:   Diet Order             Diet regular Room service appropriate? Yes; Fluid consistency: Thin  Diet effective now                  EDUCATION NEEDS:  Education needs have been addressed  Skin:  Skin Assessment: Reviewed RN Assessment  Last BM:  3/11 last bm  Height:  Ht Readings from Last 1 Encounters:  04/20/23 5\' 2"  (1.575 m)   Weight:  Wt Readings from Last 1 Encounters:  04/20/23 49.4 kg   Ideal Body Weight:  50 kg  BMI:  Body mass index is 19.94 kg/m.  Estimated Nutritional Needs:   Kcal:  1500-1700  Protein:  65-80g  Fluid:  1.5-1.7L  Louis Meckel Dietetic Intern

## 2023-04-22 ENCOUNTER — Inpatient Hospital Stay (HOSPITAL_COMMUNITY): Payer: MEDICAID | Admitting: Anesthesiology

## 2023-04-22 ENCOUNTER — Other Ambulatory Visit (HOSPITAL_COMMUNITY): Payer: Self-pay

## 2023-04-22 ENCOUNTER — Inpatient Hospital Stay (HOSPITAL_COMMUNITY): Payer: MEDICAID

## 2023-04-22 ENCOUNTER — Encounter (HOSPITAL_COMMUNITY)
Admission: EM | Disposition: A | Discharge status: Home / Self Care | Payer: Self-pay | Source: Home / Self Care | Attending: Internal Medicine

## 2023-04-22 ENCOUNTER — Other Ambulatory Visit: Payer: Self-pay

## 2023-04-22 ENCOUNTER — Encounter (HOSPITAL_COMMUNITY): Payer: Self-pay | Admitting: Internal Medicine

## 2023-04-22 DIAGNOSIS — S72042A Displaced fracture of base of neck of left femur, initial encounter for closed fracture: Secondary | ICD-10-CM

## 2023-04-22 DIAGNOSIS — F418 Other specified anxiety disorders: Secondary | ICD-10-CM

## 2023-04-22 DIAGNOSIS — S72002A Fracture of unspecified part of neck of left femur, initial encounter for closed fracture: Secondary | ICD-10-CM

## 2023-04-22 HISTORY — PX: TOTAL HIP ARTHROPLASTY: SHX124

## 2023-04-22 LAB — CBC
HCT: 31 % — ABNORMAL LOW (ref 36.0–46.0)
Hemoglobin: 10.2 g/dL — ABNORMAL LOW (ref 12.0–15.0)
MCH: 29.4 pg (ref 26.0–34.0)
MCHC: 32.9 g/dL (ref 30.0–36.0)
MCV: 89.3 fL (ref 80.0–100.0)
Platelets: 422 10*3/uL — ABNORMAL HIGH (ref 150–400)
RBC: 3.47 MIL/uL — ABNORMAL LOW (ref 3.87–5.11)
RDW: 15.8 % — ABNORMAL HIGH (ref 11.5–15.5)
WBC: 20.8 10*3/uL — ABNORMAL HIGH (ref 4.0–10.5)
nRBC: 0 % (ref 0.0–0.2)

## 2023-04-22 LAB — BASIC METABOLIC PANEL
Anion gap: 9 (ref 5–15)
BUN: 17 mg/dL (ref 6–20)
CO2: 23 mmol/L (ref 22–32)
Calcium: 8.1 mg/dL — ABNORMAL LOW (ref 8.9–10.3)
Chloride: 106 mmol/L (ref 98–111)
Creatinine, Ser: 0.52 mg/dL (ref 0.44–1.00)
GFR, Estimated: >60 mL/min (ref >60)
Glucose, Bld: 94 mg/dL (ref 70–99)
Potassium: 4.6 mmol/L (ref 3.5–5.1)
Sodium: 138 mmol/L (ref 135–145)

## 2023-04-22 LAB — GLUCOSE, CAPILLARY: Glucose-Capillary: 95 mg/dL (ref 70–99)

## 2023-04-22 LAB — CREATININE, SERUM
Creatinine, Ser: 0.49 mg/dL (ref 0.44–1.00)
GFR, Estimated: >60 mL/min (ref >60)

## 2023-04-22 SURGERY — ARTHROPLASTY, HIP, TOTAL, ANTERIOR APPROACH
Anesthesia: General | Site: Hip | Laterality: Left

## 2023-04-22 MED ORDER — FENTANYL CITRATE (PF) 250 MCG/5ML IJ SOLN
INTRAMUSCULAR | Status: AC
  Filled 2023-04-22: qty 5

## 2023-04-22 MED ORDER — 0.9 % SODIUM CHLORIDE (POUR BTL) OPTIME
TOPICAL | Status: DC | PRN
  Administered 2023-04-22: 1000 mL

## 2023-04-22 MED ORDER — ACETAMINOPHEN 160 MG/5ML PO SOLN: 325.0000 mg | ORAL | Status: DC | PRN

## 2023-04-22 MED ORDER — LIDOCAINE 2% (20 MG/ML) 5 ML SYRINGE
INTRAMUSCULAR | Status: DC | PRN
  Administered 2023-04-22: 100 mg via INTRAVENOUS

## 2023-04-22 MED ORDER — ALUM & MAG HYDROXIDE-SIMETH 200-200-20 MG/5ML PO SUSP: 30.0000 mL | ORAL | Status: DC | PRN

## 2023-04-22 MED ORDER — OXYCODONE HCL 5 MG PO TABS
5.0000 mg | ORAL_TABLET | ORAL | Status: DC | PRN
Start: 2023-04-22 — End: 2023-04-24
  Administered 2023-04-22 – 2023-04-24 (×4): 10 mg via ORAL
  Administered 2023-04-24: 5 mg via ORAL
  Filled 2023-04-22: qty 1
  Filled 2023-04-22 (×4): qty 2

## 2023-04-22 MED ORDER — ALBUTEROL SULFATE HFA 108 (90 BASE) MCG/ACT IN AERS
INHALATION_SPRAY | RESPIRATORY_TRACT | Status: DC | PRN
  Administered 2023-04-22: 4 via RESPIRATORY_TRACT

## 2023-04-22 MED ORDER — DOCUSATE SODIUM 100 MG PO CAPS
100.0000 mg | ORAL_CAPSULE | Freq: Two times a day (BID) | ORAL | Status: DC
  Administered 2023-04-22 – 2023-04-24 (×4): 100 mg via ORAL
  Filled 2023-04-22 (×4): qty 1

## 2023-04-22 MED ORDER — VANCOMYCIN HCL 1000 MG IV SOLR
INTRAVENOUS | Status: AC
  Filled 2023-04-22: qty 20

## 2023-04-22 MED ORDER — ONDANSETRON HCL 4 MG/2ML IJ SOLN
4.0000 mg | Freq: Four times a day (QID) | INTRAMUSCULAR | Status: DC | PRN
Start: 2023-04-22 — End: 2023-04-24

## 2023-04-22 MED ORDER — MAGNESIUM CITRATE PO SOLN: 1.0000 | Freq: Once | ORAL | Status: DC | PRN

## 2023-04-22 MED ORDER — OXYCODONE HCL 5 MG/5ML PO SOLN: 5.0000 mg | Freq: Once | ORAL | Status: DC | PRN

## 2023-04-22 MED ORDER — FENTANYL CITRATE (PF) 250 MCG/5ML IJ SOLN
INTRAMUSCULAR | Status: DC | PRN
  Administered 2023-04-22: 50 ug via INTRAVENOUS
  Administered 2023-04-22: 100 ug via INTRAVENOUS

## 2023-04-22 MED ORDER — ACETAMINOPHEN 500 MG PO TABS: 1000.0000 mg | ORAL_TABLET | Freq: Once | ORAL | Status: AC

## 2023-04-22 MED ORDER — SORBITOL 70 % SOLN: 30.0000 mL | Freq: Every day | Status: DC | PRN

## 2023-04-22 MED ORDER — FENTANYL CITRATE (PF) 100 MCG/2ML IJ SOLN: 25.0000 ug | INTRAMUSCULAR | Status: DC | PRN

## 2023-04-22 MED ORDER — ENOXAPARIN SODIUM 40 MG/0.4ML IJ SOSY
40.0000 mg | PREFILLED_SYRINGE | INTRAMUSCULAR | Status: DC
  Administered 2023-04-23 – 2023-04-24 (×2): 40 mg via SUBCUTANEOUS
  Filled 2023-04-22 (×2): qty 0.4

## 2023-04-22 MED ORDER — ACETAMINOPHEN 325 MG PO TABS
325.0000 mg | ORAL_TABLET | Freq: Four times a day (QID) | ORAL | Status: DC | PRN
  Filled 2023-04-22: qty 2

## 2023-04-22 MED ORDER — MEPERIDINE HCL 25 MG/ML IJ SOLN: 6.2500 mg | INTRAMUSCULAR | Status: DC | PRN

## 2023-04-22 MED ORDER — CELECOXIB 200 MG PO CAPS
ORAL_CAPSULE | ORAL | Status: AC
  Administered 2023-04-22: 200 mg via ORAL
  Filled 2023-04-22: qty 1

## 2023-04-22 MED ORDER — METHOCARBAMOL 500 MG PO TABS
500.0000 mg | ORAL_TABLET | Freq: Four times a day (QID) | ORAL | Status: DC | PRN
  Administered 2023-04-24: 500 mg via ORAL
  Filled 2023-04-22: qty 1

## 2023-04-22 MED ORDER — PHENYLEPHRINE HCL-NACL 20-0.9 MG/250ML-% IV SOLN
INTRAVENOUS | Status: DC | PRN
  Administered 2023-04-22: 40 ug/min via INTRAVENOUS

## 2023-04-22 MED ORDER — BUPIVACAINE-MELOXICAM ER 400-12 MG/14ML IJ SOLN
INTRAMUSCULAR | Status: DC | PRN
  Administered 2023-04-22: 400 mg

## 2023-04-22 MED ORDER — ACETAMINOPHEN 500 MG PO TABS
ORAL_TABLET | ORAL | Status: AC
  Administered 2023-04-22: 1000 mg via ORAL
  Filled 2023-04-22: qty 1

## 2023-04-22 MED ORDER — POLYETHYLENE GLYCOL 3350 17 G PO PACK: 17.0000 g | PACK | Freq: Every day | ORAL | Status: DC | PRN

## 2023-04-22 MED ORDER — SUGAMMADEX SODIUM 200 MG/2ML IV SOLN
INTRAVENOUS | Status: AC
  Filled 2023-04-22: qty 4

## 2023-04-22 MED ORDER — PROPOFOL 10 MG/ML IV BOLUS
INTRAVENOUS | Status: DC | PRN
  Administered 2023-04-22: 50 mg via INTRAVENOUS
  Administered 2023-04-22: 150 mg via INTRAVENOUS

## 2023-04-22 MED ORDER — TRANEXAMIC ACID-NACL 1000-0.7 MG/100ML-% IV SOLN
1000.0000 mg | Freq: Once | INTRAVENOUS | Status: AC
  Administered 2023-04-22: 1000 mg via INTRAVENOUS
  Filled 2023-04-22: qty 100

## 2023-04-22 MED ORDER — LACTATED RINGERS IV SOLN: INTRAVENOUS | Status: DC | PRN

## 2023-04-22 MED ORDER — ONDANSETRON HCL 4 MG/2ML IJ SOLN: 4.0000 mg | Freq: Once | INTRAMUSCULAR | Status: DC | PRN

## 2023-04-22 MED ORDER — ACETAMINOPHEN 500 MG PO TABS
1000.0000 mg | ORAL_TABLET | Freq: Four times a day (QID) | ORAL | Status: AC
  Administered 2023-04-22 – 2023-04-23 (×4): 1000 mg via ORAL
  Filled 2023-04-22 (×3): qty 2

## 2023-04-22 MED ORDER — PHENOL 1.4 % MT LIQD: 1.0000 | OROMUCOSAL | Status: DC | PRN

## 2023-04-22 MED ORDER — CEFAZOLIN SODIUM-DEXTROSE 2-3 GM-%(50ML) IV SOLR
INTRAVENOUS | Status: DC | PRN
  Administered 2023-04-22: 2 g via INTRAVENOUS

## 2023-04-22 MED ORDER — BUPIVACAINE-MELOXICAM ER 400-12 MG/14ML IJ SOLN
INTRAMUSCULAR | Status: AC
  Filled 2023-04-22: qty 1

## 2023-04-22 MED ORDER — ORAL CARE MOUTH RINSE: 15.0000 mL | Freq: Once | OROMUCOSAL | Status: AC

## 2023-04-22 MED ORDER — HYDROCODONE-ACETAMINOPHEN 5-325 MG PO TABS
2.0000 | ORAL_TABLET | Freq: Four times a day (QID) | ORAL | Status: DC | PRN
  Administered 2023-04-22: 2 via ORAL
  Filled 2023-04-22: qty 2

## 2023-04-22 MED ORDER — HYDROMORPHONE HCL 1 MG/ML IJ SOLN: 0.5000 mg | INTRAMUSCULAR | Status: DC | PRN

## 2023-04-22 MED ORDER — SODIUM CHLORIDE 0.9 % IR SOLN
Status: DC | PRN
  Administered 2023-04-22: 1000 mL

## 2023-04-22 MED ORDER — ALBUTEROL SULFATE HFA 108 (90 BASE) MCG/ACT IN AERS
INHALATION_SPRAY | RESPIRATORY_TRACT | Status: AC
  Filled 2023-04-22: qty 6.7

## 2023-04-22 MED ORDER — CELECOXIB 200 MG PO CAPS: 200.0000 mg | ORAL_CAPSULE | Freq: Once | ORAL | Status: AC

## 2023-04-22 MED ORDER — OXYCODONE-ACETAMINOPHEN 5-325 MG PO TABS
1.0000 | ORAL_TABLET | Freq: Two times a day (BID) | ORAL | 0 refills | Status: DC | PRN
  Filled 2023-04-22: qty 20, 5d supply, fill #0

## 2023-04-22 MED ORDER — HYDROMORPHONE HCL 1 MG/ML IJ SOLN
INTRAMUSCULAR | Status: DC | PRN
  Administered 2023-04-22: .5 mg via INTRAVENOUS

## 2023-04-22 MED ORDER — DEXMEDETOMIDINE HCL IN NACL 80 MCG/20ML IV SOLN
INTRAVENOUS | Status: DC | PRN
  Administered 2023-04-22 (×2): 8 ug via INTRAVENOUS

## 2023-04-22 MED ORDER — TRANEXAMIC ACID 1000 MG/10ML IV SOLN
2000.0000 mg | Freq: Once | INTRAVENOUS | Status: AC
  Administered 2023-04-22: 2000 mg via TOPICAL
  Filled 2023-04-22: qty 20

## 2023-04-22 MED ORDER — VANCOMYCIN HCL 1 G IV SOLR
INTRAVENOUS | Status: DC | PRN
  Administered 2023-04-22: 1000 mg via TOPICAL

## 2023-04-22 MED ORDER — ENOXAPARIN SODIUM 40 MG/0.4ML IJ SOSY
40.0000 mg | PREFILLED_SYRINGE | Freq: Every day | INTRAMUSCULAR | 0 refills | Status: DC
  Filled 2023-04-22: qty 5.6, 14d supply, fill #0

## 2023-04-22 MED ORDER — SUGAMMADEX SODIUM 200 MG/2ML IV SOLN
INTRAVENOUS | Status: DC | PRN
  Administered 2023-04-22: 200 mg via INTRAVENOUS

## 2023-04-22 MED ORDER — ROCURONIUM BROMIDE 10 MG/ML (PF) SYRINGE
PREFILLED_SYRINGE | INTRAVENOUS | Status: DC | PRN
  Administered 2023-04-22: 60 mg via INTRAVENOUS

## 2023-04-22 MED ORDER — OXYCODONE HCL 5 MG PO TABS
10.0000 mg | ORAL_TABLET | ORAL | Status: DC | PRN
  Administered 2023-04-24: 10 mg via ORAL
  Filled 2023-04-22: qty 2

## 2023-04-22 MED ORDER — HYDROMORPHONE HCL 1 MG/ML IJ SOLN
INTRAMUSCULAR | Status: AC
Start: 2023-04-22 — End: ?
  Filled 2023-04-22: qty 0.5

## 2023-04-22 MED ORDER — PRONTOSAN WOUND IRRIGATION OPTIME
TOPICAL | Status: DC | PRN
  Administered 2023-04-22: 1 via TOPICAL

## 2023-04-22 MED ORDER — LACTATED RINGERS IV SOLN: INTRAVENOUS | Status: DC

## 2023-04-22 MED ORDER — ACETAMINOPHEN 325 MG PO TABS: 325.0000 mg | ORAL_TABLET | ORAL | Status: DC | PRN

## 2023-04-22 MED ORDER — OXYCODONE HCL 5 MG PO TABS: 5.0000 mg | ORAL_TABLET | Freq: Once | ORAL | Status: DC | PRN

## 2023-04-22 MED ORDER — CEFAZOLIN SODIUM-DEXTROSE 2-4 GM/100ML-% IV SOLN
2.0000 g | Freq: Four times a day (QID) | INTRAVENOUS | Status: AC
  Administered 2023-04-22 – 2023-04-23 (×3): 2 g via INTRAVENOUS
  Filled 2023-04-22 (×3): qty 100

## 2023-04-22 MED ORDER — PHENYLEPHRINE 80 MCG/ML (10ML) SYRINGE FOR IV PUSH (FOR BLOOD PRESSURE SUPPORT)
PREFILLED_SYRINGE | INTRAVENOUS | Status: DC | PRN
  Administered 2023-04-22: 160 ug via INTRAVENOUS
  Administered 2023-04-22: 40 ug via INTRAVENOUS

## 2023-04-22 MED ORDER — ONDANSETRON HCL 4 MG/2ML IJ SOLN
INTRAMUSCULAR | Status: DC | PRN
  Administered 2023-04-22: 4 mg via INTRAVENOUS

## 2023-04-22 MED ORDER — FENTANYL CITRATE PF 50 MCG/ML IJ SOSY: 25.0000 ug | PREFILLED_SYRINGE | INTRAMUSCULAR | Status: DC | PRN

## 2023-04-22 MED ORDER — DEXAMETHASONE SODIUM PHOSPHATE 10 MG/ML IJ SOLN
INTRAMUSCULAR | Status: DC | PRN
  Administered 2023-04-22: 10 mg via INTRAVENOUS

## 2023-04-22 MED ORDER — METHOCARBAMOL 1000 MG/10ML IJ SOLN: 500.0000 mg | Freq: Four times a day (QID) | INTRAMUSCULAR | Status: DC | PRN

## 2023-04-22 MED ORDER — MENTHOL 3 MG MT LOZG: 1.0000 | LOZENGE | OROMUCOSAL | Status: DC | PRN

## 2023-04-22 MED ORDER — CHLORHEXIDINE GLUCONATE 0.12 % MT SOLN
OROMUCOSAL | Status: AC
  Administered 2023-04-22: 15 mL via OROMUCOSAL
  Filled 2023-04-22: qty 15

## 2023-04-22 MED ORDER — CHLORHEXIDINE GLUCONATE 0.12 % MT SOLN: 15.0000 mL | Freq: Once | OROMUCOSAL | Status: AC

## 2023-04-22 MED ORDER — ONDANSETRON HCL 4 MG PO TABS: 4.0000 mg | ORAL_TABLET | Freq: Four times a day (QID) | ORAL | Status: DC | PRN

## 2023-04-22 MED ORDER — PROPOFOL 10 MG/ML IV BOLUS
INTRAVENOUS | Status: AC
  Filled 2023-04-22: qty 20

## 2023-04-22 SURGICAL SUPPLY — 57 items
BAG COUNTER SPONGE SURGICOUNT (BAG) ×2 IMPLANT
BAG DECANTER FOR FLEXI CONT (MISCELLANEOUS) ×2 IMPLANT
BLADE SAG 18X100X1.27 (BLADE) ×2 IMPLANT
COVER PERINEAL POST (MISCELLANEOUS) ×2 IMPLANT
COVER SURGICAL LIGHT HANDLE (MISCELLANEOUS) ×2 IMPLANT
CUP SECTOR GRIPTON 50MM (Cup) IMPLANT
DERMABOND ADVANCED .7 DNX12 (GAUZE/BANDAGES/DRESSINGS) IMPLANT
DRAPE C-ARM 42X72 X-RAY (DRAPES) ×2 IMPLANT
DRAPE POUCH INSTRU U-SHP 10X18 (DRAPES) ×2 IMPLANT
DRAPE STERI IOBAN 125X83 (DRAPES) ×2 IMPLANT
DRAPE U-SHAPE 47X51 STRL (DRAPES) ×4 IMPLANT
DRSG AQUACEL AG ADV 3.5X10 (GAUZE/BANDAGES/DRESSINGS) ×2 IMPLANT
DURAPREP 26ML APPLICATOR (WOUND CARE) ×4 IMPLANT
ELECT BLADE 4.0 EZ CLEAN MEGAD (MISCELLANEOUS) ×1 IMPLANT
ELECT REM PT RETURN 9FT ADLT (ELECTROSURGICAL) ×1 IMPLANT
ELECTRODE BLDE 4.0 EZ CLN MEGD (MISCELLANEOUS) ×2 IMPLANT
ELECTRODE REM PT RTRN 9FT ADLT (ELECTROSURGICAL) ×2 IMPLANT
GLOVE BIOGEL PI IND STRL 7.0 (GLOVE) ×4 IMPLANT
GLOVE BIOGEL PI IND STRL 7.5 (GLOVE) ×10 IMPLANT
GLOVE ECLIPSE 7.0 STRL STRAW (GLOVE) ×4 IMPLANT
GLOVE SKINSENSE STRL SZ7.5 (GLOVE) ×2 IMPLANT
GLOVE SURG SYN 7.5 E (GLOVE) ×2 IMPLANT
GLOVE SURG SYN 7.5 PF PI (GLOVE) ×4 IMPLANT
GLOVE SURG UNDER POLY LF SZ7 (GLOVE) ×6 IMPLANT
GLOVE SURG UNDER POLY LF SZ7.5 (GLOVE) ×4 IMPLANT
GOWN STRL REUS W/ TWL LRG LVL3 (GOWN DISPOSABLE) IMPLANT
GOWN STRL REUS W/ TWL XL LVL3 (GOWN DISPOSABLE) ×2 IMPLANT
GOWN STRL SURGICAL XL XLNG (GOWN DISPOSABLE) ×2 IMPLANT
GOWN TOGA ZIPPER T7+ PEEL AWAY (MISCELLANEOUS) ×2 IMPLANT
HEAD FEMORAL 32 CERAMIC (Hips) IMPLANT
HOOD PEEL AWAY T7 (MISCELLANEOUS) ×2 IMPLANT
IV NS IRRIG 3000ML ARTHROMATIC (IV SOLUTION) ×2 IMPLANT
KIT BASIN OR (CUSTOM PROCEDURE TRAY) ×2 IMPLANT
LINER ACET PNNCL PLUS4 NEUTRAL (Hips) IMPLANT
MARKER SKIN DUAL TIP RULER LAB (MISCELLANEOUS) ×2 IMPLANT
NDL SPNL 18GX3.5 QUINCKE PK (NEEDLE) ×2 IMPLANT
NEEDLE SPNL 18GX3.5 QUINCKE PK (NEEDLE) ×1 IMPLANT
PACK TOTAL JOINT (CUSTOM PROCEDURE TRAY) ×2 IMPLANT
PACK UNIVERSAL I (CUSTOM PROCEDURE TRAY) ×2 IMPLANT
PINNACLE PLUS 4 NEUTRAL (Hips) ×1 IMPLANT
SCREW PINN CAN 6.5X20 (Screw) IMPLANT
SET HNDPC FAN SPRY TIP SCT (DISPOSABLE) ×2 IMPLANT
SOLUTION PRONTOSAN WOUND 350ML (IRRIGATION / IRRIGATOR) ×2 IMPLANT
STEM FEM ACTIS STD SZ4 (Stem) IMPLANT
SUT ETHIBOND 2 V 37 (SUTURE) ×2 IMPLANT
SUT ETHILON 2 0 FS 18 (SUTURE) IMPLANT
SUT STRATAFIX 1PDS 45CM VIOLET (SUTURE) IMPLANT
SUT VIC AB 0 CT1 27XBRD ANBCTR (SUTURE) ×2 IMPLANT
SUT VIC AB 1 CTX36XBRD ANBCTR (SUTURE) ×2 IMPLANT
SUT VIC AB 2-0 CT1 (SUTURE) IMPLANT
SUT VIC AB 2-0 CT1 TAPERPNT 27 (SUTURE) ×4 IMPLANT
SYR 50ML LL SCALE MARK (SYRINGE) ×2 IMPLANT
TOWEL GREEN STERILE (TOWEL DISPOSABLE) ×2 IMPLANT
TRAY CATH INTERMITTENT SS 16FR (CATHETERS) IMPLANT
TRAY FOLEY W/BAG SLVR 16FR ST (SET/KITS/TRAYS/PACK) IMPLANT
TUBE SUCT ARGYLE STRL (TUBING) ×2 IMPLANT
YANKAUER SUCT BULB TIP NO VENT (SUCTIONS) ×2 IMPLANT

## 2023-04-22 NOTE — Progress Notes (Signed)
 [facility] PROGRESS NOTE    Progress Note  [name]  ZOX:[id number] DOB: [birth year] DOA: 04/20/2023 PCP: Patient, No Pcp Per     Brief Narrative:   [name] is an 63 y.o. female past medical history of tobacco abuse was seen in recently after an initial mechanical fall in the muddy driveway.  Has been taking NSAIDs at home, which she relates her pain has not improved.  Relates that today she was not able to bear weight, and show an impacted mid cervical left femur fracture BTK was consulted recommended surgical intervention on 04/22/2023.  Assessment/Plan:   Femur fracture, left [facility]) Orthopedic surgery was consulted recommended surgical intervention on 04/22/2023. N.p.o. after midnight continue narcotics for pain control.  Add Robaxin. Started on bowel regimen. Incentive spirometry postop. Anticoagulation and narcotics per orthopedic surgery  Hypokalemia: Repleted orally now improved.  Leukocytosis: Likely reactive due to fracture.  Thrombocytosis Likely reactive.   DVT prophylaxis: lovenox Family Communication:none Status is: Inpatient Remains inpatient appropriate because: Acute left hip fracture    Code Status:     Code Status Orders  (From admission, onward)           Start     Ordered   04/20/23 1702  Full code  Continuous       Question:  By:  Answer:  Other   04/20/23 1707           Code Status History     This patient has a current code status but no historical code status.         IV Access:   Peripheral IV   Procedures and diagnostic studies:   DG Chest 1 View Result Date: 04/20/2023 CLINICAL DATA:  Hip fracture, preop EXAM: CHEST  1 VIEW COMPARISON:  None Available. FINDINGS: Lungs are clear. Heart size and mediastinal contours are within normal limits. Heavy mitral annulus calcifications. Aortic Atherosclerosis (ICD10-170.0). No effusion. Visualized bones unremarkable. IMPRESSION: 1. No acute cardiopulmonary  disease. Electronically Signed   By: Corlis Leak M.D.   On: 04/20/2023 16:44   DG Hip Unilat W or Wo Pelvis 2-3 Views Left Result Date: 04/20/2023 CLINICAL DATA:  pain EXAM: DG HIP (WITH OR WITHOUT PELVIS) 2-3V LEFT COMPARISON:  CT 10/11/2011 FINDINGS: Impacted mid cervical left femur fracture, new since previous. No dislocation. Bony pelvis intact. Mild spondylitic changes in the visualized lower lumbar spine. IMPRESSION: Impacted mid cervical left femur fracture. Electronically Signed   By: Corlis Leak M.D.   On: 04/20/2023 16:44     Medical Consultants:   None.   Subjective:    [name] pain is control, has not had a bowel movement.  Objective:    Vitals:   04/21/23 1455 04/21/23 1946 04/22/23 0327 04/22/23 0832  BP: 102/63 126/66 121/69 120/63  Pulse: 81 88 82 80  Resp: 18 19 19 20   Temp: 98.3 F (36.8 C) 98 F (36.7 C) 98 F (36.7 C) 98 F (36.7 C)  TempSrc:  Oral Oral Oral  SpO2: 97% 96% 95% 95%  Weight:      Height:       SpO2: 95 %   Intake/Output Summary (Last 24 hours) at 04/22/2023 1025 Last data filed at 04/21/2023 1700 Gross per 24 hour  Intake 657.61 ml  Output --  Net 657.61 ml   Filed Weights   04/20/23 1327  Weight: 49.4 kg    Exam: General exam: In no acute distress. Respiratory system: Good air movement and clear to auscultation. Cardiovascular  system: S1 & S2 heard, RRR. No JVD. Gastrointestinal system: Abdomen is nondistended, soft and nontender.  Extremities: No pedal edema. Skin: No rashes, lesions or ulcers Psychiatry: Judgement and insight appear normal. Mood & affect appropriate. Data Reviewed:    Labs: Basic Metabolic Panel: Recent Labs  Lab 04/20/23 1509 04/22/23 0554  NA 134* 138  K 2.9* 4.6  CL 101 106  CO2 21* 23  GLUCOSE 101* 94  BUN 8 17  CREATININE 0.57 0.52  CALCIUM 8.7* 8.1*   GFR Estimated Creatinine Clearance: 58.3 mL/min (by C-G formula based on SCr of 0.52 mg/dL). Liver Function Tests: No results  for input(s): "AST", "ALT", "ALKPHOS", "BILITOT", "PROT", "ALBUMIN" in the last 168 hours. No results for input(s): "LIPASE", "AMYLASE" in the last 168 hours. No results for input(s): "AMMONIA" in the last 168 hours. Coagulation profile Recent Labs  Lab 04/20/23 1509  INR 1.1   COVID-19 Labs  No results for input(s): "DDIMER", "FERRITIN", "LDH", "CRP" in the last 72 hours.  No results found for: "SARSCOV2NAA"  CBC: Recent Labs  Lab 04/20/23 1509  WBC 14.2*  NEUTROABS 11.0*  HGB 12.6  HCT 37.0  MCV 87.1  PLT 577*   Cardiac Enzymes: No results for input(s): "CKTOTAL", "CKMB", "CKMBINDEX", "TROPONINI" in the last 168 hours. BNP (last 3 results) No results for input(s): "PROBNP" in the last 8760 hours. CBG: Recent Labs  Lab 04/21/23 0806 04/22/23 0621  GLUCAP 100* 95   D-Dimer: No results for input(s): "DDIMER" in the last 72 hours. Hgb A1c: No results for input(s): "HGBA1C" in the last 72 hours. Lipid Profile: No results for input(s): "CHOL", "HDL", "LDLCALC", "TRIG", "CHOLHDL", "LDLDIRECT" in the last 72 hours. Thyroid function studies: No results for input(s): "TSH", "T4TOTAL", "T3FREE", "THYROIDAB" in the last 72 hours.  Invalid input(s): "FREET3" Anemia work up: No results for input(s): "VITAMINB12", "FOLATE", "FERRITIN", "TIBC", "IRON", "RETICCTPCT" in the last 72 hours. Sepsis Labs: Recent Labs  Lab 04/20/23 1509  WBC 14.2*   Microbiology Recent Results (from the past 240 hours)  Surgical PCR screen     Status: None   Collection Time: 04/20/23 12:15 AM   Specimen: Nasal Mucosa; Nasal Swab  Result Value Ref Range Status   MRSA, PCR NEGATIVE NEGATIVE Final   Staphylococcus aureus NEGATIVE NEGATIVE Final    Comment: (NOTE) The Xpert SA Assay (FDA approved for NASAL specimens in patients 36 years of age and older), is one component of a comprehensive surveillance program. It is not intended to diagnose infection nor to guide or monitor  treatment. Performed at [facility], [address] [place], [place] [address]      Medications:    feeding supplement  237 mL Oral BID BM   mupirocin ointment  1 Application Nasal BID   pantoprazole  40 mg Oral BID   polyethylene glycol  17 g Oral BID   Continuous Infusions:      LOS: 2 days   Marinda Elk  [facility]  04/22/2023, 10:25 AM

## 2023-04-22 NOTE — Progress Notes (Signed)
 Per request of patient, she wants daughter to be present when case manger/ social worker talks to the patient either via phone call or person. Patients daughter name is Hospital doctor and phone number is [phone].

## 2023-04-22 NOTE — Anesthesia Preprocedure Evaluation (Addendum)
 Anesthesia Evaluation  Patient identified by MRN, date of birth, ID band Patient awake    Reviewed: Allergy & Precautions, H&P , NPO status , Patient's Chart, lab work & pertinent test results  Airway Mallampati: I  TM Distance: >3 FB Neck ROM: Full    Dental no notable dental hx. (+) Edentulous Upper, Poor Dentition, Dental Advisory Given,    Pulmonary neg pulmonary ROS, Current Smoker and Patient abstained from smoking.   Pulmonary exam normal breath sounds clear to auscultation       Cardiovascular Exercise Tolerance: Good negative cardio ROS Normal cardiovascular exam Rhythm:Regular Rate:Normal     Neuro/Psych Seizures -, Well Controlled,  PSYCHIATRIC DISORDERS Anxiety Depression Bipolar Disorder   negative neurological ROS  negative psych ROS   GI/Hepatic negative GI ROS, Neg liver ROS,,,(+)     substance abuse  alcohol use  Endo/Other  negative endocrine ROS    Renal/GU negative Renal ROS  negative genitourinary   Musculoskeletal negative musculoskeletal ROS (+) Arthritis , Osteoarthritis,    Abdominal   Peds negative pediatric ROS (+)  Hematology negative hematology ROS (+)   Anesthesia Other Findings   Reproductive/Obstetrics negative OB ROS                             Anesthesia Physical Anesthesia Plan  ASA: 3  Anesthesia Plan: General   Post-op Pain Management: Tylenol PO (pre-op)* and Celebrex PO (pre-op)*   Induction: Intravenous  PONV Risk Score and Plan:   Airway Management Planned: Oral ETT  Additional Equipment: None  Intra-op Plan:   Post-operative Plan: Extubation in OR  Informed Consent: I have reviewed the patients History and Physical, chart, labs and discussed the procedure including the risks, benefits and alternatives for the proposed anesthesia with the patient or authorized representative who has indicated his/her understanding and acceptance.        Plan Discussed with: Anesthesiologist and CRNA  Anesthesia Plan Comments: (  )        Anesthesia Quick Evaluation

## 2023-04-22 NOTE — Op Note (Signed)
 ARTHROPLASTY, HIP, TOTAL, ANTERIOR APPROACH  Procedure Note Neyah Ellerman   [id number]  Pre-op Diagnosis: Left femoral neck fracture     Post-op Diagnosis: same  Operative Findings Acute subcapital femoral neck fracture, left hip Osteopenic bone   Operative Procedures  1. Total hip replacement; Left hip; uncemented [id number]   Surgeon: Gershon Mussel, M.D.  Assist: Oneal Grout, PA-C   Anesthesia: general  Prosthesis: Depuy Acetabulum: Pinnacle 50 mm Femur: Actis 4 STD Head: 32 mm size: +1 Liner: +4 Bearing Type: ceramic/poly  Total Hip Arthroplasty (Anterior Approach) Op Note:  After informed consent was obtained and the operative extremity marked in the holding area, the patient was brought back to the operating room and placed supine on the HANA table. Next, the operative extremity was prepped and draped in normal sterile fashion. Surgical timeout occurred verifying patient identification, surgical site, surgical procedure and administration of antibiotics.  A 8 cm longitudinal incision was made starting from 2 fingerbreadths lateral and inferior to the ASIS towards the lateral aspect of the patella.  A Hueter approach to the hip was performed, using the interval between tensor fascia lata and sartorius.  Dissection was carried bluntly down onto the anterior hip capsule. The lateral femoral circumflex vessels were identified and coagulated. A capsulotomy was performed and the capsular flaps tagged for later repair.  The neck osteotomy was performed. The femoral head was removed, the acetabular rim was cleared of soft tissue and osteophytes and attention was turned to reaming the acetabulum.  Sequential reaming was performed under fluoroscopic guidance down to the floor of the cotyloid fossa. We reamed to a size 49 mm, and then impacted the acetabular shell. A 30 mm cancellous screw was placed to secure the shell.  The liner was then placed after irrigation and attention turned  to the femur.  After placing the femoral hook, the leg was taken to externally rotated, extended and adducted position taking care to perform soft tissue releases to allow for adequate mobilization of the femur. Soft tissue was cleared from the shoulder of the greater trochanter and the hook elevator used to improve exposure of the proximal femur. Sequential broaching performed up to a size 4. Trial neck and head were placed. The leg was brought back up to neutral and the construct reduced.  The position and sizing of components, offset and leg lengths were checked using fluoroscopy. Stability of the construct was checked in 45 degrees of hip extension and 90 degrees of external rotation without any subluxation, shuck or impingement of prosthesis. We dislocated the prosthesis, dropped the leg back into position, removed trial components, and irrigated copiously. The final stem and head was then placed, the leg brought back up, the system reduced and fluoroscopy used to verify positioning.  Antibiotic irrigation was placed in the surgical wound.   We irrigated, obtained hemostasis and closed the capsule using #2 ethibond suture.  A topical mixture of 0.25% bupivacaine and meloxicam was placed deep to the fascia.  One gram of vancomycin powder was placed in the surgical bed.   One gram of topical tranexamic acid was injected into the joint.  The fascia was closed with #1 stratafix, the deep fat layer was closed with 0 vicryl, the subcutaneous layers closed with 2.0 Vicryl Plus and the skin closed with 2.0 nylon and dermabond. A sterile dressing was applied. The patient was awakened in the operating room and taken to recovery in stable condition.  All sponge, needle, and instrument counts were correct  at the end of the case.   Tessa Lerner, my PA, was a medical necessity for opening, closing, limb positioning, retracting, exposing, and overall facilitation and timely completion of the surgery.  Position:  supine  Complications: see description of procedure.  Time Out: performed   Drains/Packing: none  Estimated blood loss: see anesthesia record  Returned to Recovery Room: in good condition.   Antibiotics: yes   Mechanical VTE (DVT) Prophylaxis: sequential compression devices, TED thigh-high  Chemical VTE (DVT) Prophylaxis: lovenox   Fluid Replacement: see anesthesia record  Specimens Removed: 1 to pathology   Sponge and Instrument Count Correct? yes   PACU: portable radiograph - low AP   Plan/RTC: Return in 2 weeks for staple removal. Weight Bearing/Load Lower Extremity: full  Hip precautions: none Suture Removal: 2 weeks   N. Glee Arvin, MD Aldean Baker 2:23 PM   Implant Name Type Inv. Item Serial No. Manufacturer Lot No. LRB No. Used Action  CUP SECTOR GRIPTON - [id number] Cup CUP SECTOR GRIPTON  [employer]PU[employer] [id number] Left 1 Implanted  PINNACLE PLUS 4 NEUTRAL - [id number] Hips PINNACLE PLUS 4 NEUTRAL  [employer]PU[employer]TH[employer] [id number] Left 1 Implanted  SCREW PINN CAN 6.5X20 - [id number] Screw SCREW PINN CAN 6.5X20  [employer]PU[employer] [id number] Left 1 Implanted  STEM FEM ACTIS STD SZ4 - [id number] Stem STEM FEM ACTIS STD SZ4  [employer]PU[employer] [id number] Left 1 Implanted  HEAD FEMORAL 32 CERAMIC - [id number] Hips HEAD FEMORAL 32 CERAMIC  [employer]PU[employer] [id number] Left 1 Implanted

## 2023-04-22 NOTE — TOC Progression Note (Addendum)
 Transition of Care [facility]) - Progression Note    Patient Details  Name: [name] MRN: [id number] Date of Birth: January 27, 1961  [facility]) CM/SW Contact  Epifanio Lesches, RN Phone Number: 04/22/2023, 3:40 PM  Clinical Narrative:    Pt without  health insurance . No job. Limited income. MATCH MEDICATION ASSISTANCE CARD Pharmacies please call [phone] for claim processing assistance.  Rx BIN: [id number] Rx Group: [id number] Rx PCN: [id number] Relationship Code: 1 Person Code: 01  Patient ID (MRN): MOSES MRN: [id number]  [id number]    Patient Name: [name]      Patient DOB: 07/30/1960   Discharge Date: 04/22/2023  Expiration Date: 04/30/2023 (must be filled within 7 days of discharge)  Gae Gallop RN,BSN,CM    Barriers to Discharge: No Barriers Identified  Expected Discharge Plan and Services                                               Social Determinants of Health (SDOH) Interventions SDOH Screenings   Food Insecurity: No Food Insecurity (04/20/2023)  Housing: Low Risk  (04/20/2023)  Transportation Needs: No Transportation Needs (04/20/2023)  Utilities: Not At Risk (04/20/2023)  Tobacco Use: High Risk (04/22/2023)    Readmission Risk Interventions     No data to display

## 2023-04-22 NOTE — Anesthesia Procedure Notes (Signed)
 Procedure Name: Intubation Date/Time: 04/22/2023 1:23 PM  Performed by: Allyn Kenner, CRNAPre-anesthesia Checklist: Patient identified, Emergency Drugs available, Suction available and Patient being monitored Patient Re-evaluated:Patient Re-evaluated prior to induction Oxygen Delivery Method: Circle System Utilized Preoxygenation: Pre-oxygenation with 100% oxygen Induction Type: IV induction Ventilation: Mask ventilation without difficulty Laryngoscope Size: Mac and 3 Grade View: Grade I Tube type: Oral Tube size: 7.0 mm Number of attempts: 1 Airway Equipment and Method: Stylet and Oral airway Placement Confirmation: ETT inserted through vocal cords under direct vision, positive ETCO2 and breath sounds checked- equal and bilateral Secured at: 21 cm Tube secured with: Tape Dental Injury: Teeth and Oropharynx as per pre-operative assessment

## 2023-04-22 NOTE — Transfer of Care (Signed)
 Immediate Anesthesia Transfer of Care Note  Patient: [name]  Procedure(s) Performed: ARTHROPLASTY, HIP, TOTAL, ANTERIOR APPROACH (Left: Hip)  Patient Location: PACU  Anesthesia Type:General  Level of Consciousness: awake, alert , and oriented  Airway & Oxygen Therapy: Patient Spontanous Breathing and Patient connected to face mask oxygen  Post-op Assessment: Report given to RN and Post -op Vital signs reviewed and stable  Post vital signs: Reviewed and stable  Last Vitals:  Vitals Value Taken Time  BP 112/65 04/22/23 1448  Temp    Pulse 89 04/22/23 1452  Resp 17 04/22/23 1452  SpO2 95 % 04/22/23 1452  Vitals shown include unfiled device data.  Last Pain:  Vitals:   04/22/23 1137  TempSrc:   PainSc: 7          Complications: No notable events documented.

## 2023-04-22 NOTE — TOC Transition Note (Incomplete)
 Transition of Care [facility]) - Discharge Note   Patient Details  Name: [name] MRN: [id number] Date of Birth: July 29, 1960  Transition of Care [facility]) CM/SW Contact:  Epifanio Lesches, RN Phone Number: 04/22/2023, 3:34 PM   Clinical Narrative:     MATCH MEDICATION ASSISTANCE CARD Pharmacies please call [phone] for claim processing assistance.  Rx BIN: [id number] Rx Group: [id number] Rx PCN: [id number] Relationship Code: 1 Person Code: 01  Patient ID (MRN): MOSES MRN: [id number]  [id number]    Patient Name: Ardelle Haliburton      Patient DOB: [birth year]   Discharge Date: 04/22/2023  Expiration Date: 04/30/2023 (must be filled within 7 days of discharge)    Final next level of care: Home/Self Care Barriers to Discharge: No Barriers Identified   Patient Goals and CMS Choice            Discharge Placement                       Discharge Plan and Services Additional resources added to the After Visit Summary for                                       Social Drivers of Health (SDOH) Interventions SDOH Screenings   Food Insecurity: No Food Insecurity (04/20/2023)  Housing: Low Risk  (04/20/2023)  Transportation Needs: No Transportation Needs (04/20/2023)  Utilities: Not At Risk (04/20/2023)  Tobacco Use: High Risk (04/22/2023)     Readmission Risk Interventions     No data to display

## 2023-04-22 NOTE — H&P (Signed)

## 2023-04-23 ENCOUNTER — Inpatient Hospital Stay (HOSPITAL_COMMUNITY): Payer: MEDICAID

## 2023-04-23 ENCOUNTER — Encounter (HOSPITAL_COMMUNITY): Payer: Self-pay | Admitting: Orthopaedic Surgery

## 2023-04-23 ENCOUNTER — Other Ambulatory Visit (HOSPITAL_COMMUNITY): Payer: Self-pay

## 2023-04-23 LAB — BASIC METABOLIC PANEL
Anion gap: 7 (ref 5–15)
BUN: 17 mg/dL (ref 6–20)
CO2: 21 mmol/L — ABNORMAL LOW (ref 22–32)
Calcium: 8.3 mg/dL — ABNORMAL LOW (ref 8.9–10.3)
Chloride: 104 mmol/L (ref 98–111)
Creatinine, Ser: 0.62 mg/dL (ref 0.44–1.00)
GFR, Estimated: >60 mL/min (ref >60)
Glucose, Bld: 165 mg/dL — ABNORMAL HIGH (ref 70–99)
Potassium: 4.4 mmol/L (ref 3.5–5.1)
Sodium: 132 mmol/L — ABNORMAL LOW (ref 135–145)

## 2023-04-23 LAB — CBC
HCT: 27.8 % — ABNORMAL LOW (ref 36.0–46.0)
Hemoglobin: 9.2 g/dL — ABNORMAL LOW (ref 12.0–15.0)
MCH: 29.2 pg (ref 26.0–34.0)
MCHC: 33.1 g/dL (ref 30.0–36.0)
MCV: 88.3 fL (ref 80.0–100.0)
Platelets: 392 10*3/uL (ref 150–400)
RBC: 3.15 MIL/uL — ABNORMAL LOW (ref 3.87–5.11)
RDW: 15.5 % (ref 11.5–15.5)
WBC: 18.6 10*3/uL — ABNORMAL HIGH (ref 4.0–10.5)
nRBC: 0 % (ref 0.0–0.2)

## 2023-04-23 MED ORDER — OXYCODONE-ACETAMINOPHEN 5-325 MG PO TABS: 1.0000 | ORAL_TABLET | Freq: Two times a day (BID) | ORAL | 0 refills | Status: DC | PRN

## 2023-04-23 MED ORDER — ENOXAPARIN SODIUM 40 MG/0.4ML IJ SOSY: 40.0000 mg | PREFILLED_SYRINGE | Freq: Every day | INTRAMUSCULAR | 0 refills | Status: DC

## 2023-04-23 NOTE — Anesthesia Postprocedure Evaluation (Signed)
 Anesthesia Post Note  Patient: [name]  Procedure(s) Performed: ARTHROPLASTY, HIP, TOTAL, ANTERIOR APPROACH (Left: Hip)     Patient location during evaluation: PACU Anesthesia Type: General Level of consciousness: awake and alert Pain management: pain level controlled Vital Signs Assessment: post-procedure vital signs reviewed and stable Respiratory status: spontaneous breathing, nonlabored ventilation, respiratory function stable and patient connected to nasal cannula oxygen Cardiovascular status: blood pressure returned to baseline and stable Postop Assessment: no apparent nausea or vomiting Anesthetic complications: no   There were no known notable events for this encounter.  Last Vitals:  Vitals:   04/23/23 0924 04/23/23 1442  BP: 128/64 120/69  Pulse: 72 81  Resp: 18 18  Temp: 36.7 C 36.8 C  SpO2: 98% 94%    Last Pain:  Vitals:   04/23/23 1806  TempSrc:   PainSc: 8    Pain Goal:                   Torre Schaumburg

## 2023-04-23 NOTE — Progress Notes (Signed)
 Subjective: 1 Day Post-Op Procedure(s) (LRB): ARTHROPLASTY, HIP, TOTAL, ANTERIOR APPROACH (Left) Patient reports pain as mild.    Objective: Vital signs in last 24 hours: Temp:  [97.8 F (36.6 C)-98 F (36.7 C)] 98 F (36.7 C) (03/14 0432) Pulse Rate:  [63-88] 78 (03/14 0432) Resp:  [12-23] 18 (03/13 1604) BP: (100-126)/(60-86) 121/64 (03/14 0432) SpO2:  [90 %-98 %] 94 % (03/14 0432) Weight:  [49.9 kg] 49.9 kg (03/13 1130)  Intake/Output from previous day: 03/13 0701 - 03/14 0700 In: 150 [I.V.:100; IV Piggyback:50] Out: 850 [Urine:800; Blood:50] Intake/Output this shift: No intake/output data recorded.  Recent Labs    04/20/23 1509 04/22/23 1830 04/23/23 0440  HGB 12.6 10.2* 9.2*   Recent Labs    04/22/23 1830 04/23/23 0440  WBC 20.8* 18.6*  RBC 3.47* 3.15*  HCT 31.0* 27.8*  PLT 422* 392   Recent Labs    04/22/23 0554 04/22/23 1830 04/23/23 0440  NA 138  --  132*  K 4.6  --  4.4  CL 106  --  104  CO2 23  --  21*  BUN 17  --  17  CREATININE 0.52 0.49 0.62  GLUCOSE 94  --  165*  CALCIUM 8.1*  --  8.3*   Recent Labs    04/20/23 1509  INR 1.1    Neurologically intact Neurovascular intact Sensation intact distally Intact pulses distally Dorsiflexion/Plantar flexion intact Incision: dressing C/D/I No cellulitis present Compartment soft   Assessment/Plan: 1 Day Post-Op Procedure(s) (LRB): ARTHROPLASTY, HIP, TOTAL, ANTERIOR APPROACH (Left) Up with therapy WBAT LLE ABLA- mild and stable Dvt ppx- lovenox/SCDs Pain control-oxy F/u with ortho 2 weeks po      Cristie Hem 04/23/2023, 8:00 AM

## 2023-04-23 NOTE — Evaluation (Signed)
 Physical Therapy Evaluation Patient Details Name: [name] MRN: [id number] DOB: [birth year] Today's Date: 04/23/2023  History of Present Illness  63 y.o. female adm 3/11.  PMH includes: Tobacco abuse and allergies to Seroquel and sulfa antibiotics.  Mechanical fall at her son's home.  S/p THA 3/13. WBAT.  Clinical Impression  Patient presents with decreased mobility due to pain L LE post surgical fixation.  She is moving mostly with S and cues.  She will have family support over the weekend then likely home alone though sister lives close and can come help when needed.  Feel she should be able to progress with mobility and not need follow up PT as she reports not insurance.  Feel another PT session prior to d/c to review activity progression, HEP and answer any questions will help prior to d/c.       If plan is discharge home, recommend the following: Assistance with cooking/housework;Assist for transportation   Can travel by private vehicle        Equipment Recommendations Rolling walker (2 wheels) ([employer] RW)  Recommendations for Other Services       Functional Status Assessment Patient has had a recent decline in their functional status and demonstrates the ability to make significant improvements in function in a reasonable and predictable amount of time.     Precautions / Restrictions Precautions Precautions: Fall Recall of Precautions/Restrictions: Intact Restrictions Weight Bearing Restrictions Per Provider Order: No      Mobility  Bed Mobility Overal bed mobility: Needs Assistance Bed Mobility: Supine to Sit, Sit to Supine     Supine to sit: Supervision Sit to supine: Supervision   General bed mobility comments: cues for technique to assist L LE into and out of bed (simulated from L side of bed in case going to sleep at her son's home)    Transfers Overall transfer level: Needs assistance Equipment used: Rolling walker (2 wheels) Transfers: Sit to/from  Stand Sit to Stand: Supervision           General transfer comment: no physical assistance, cues for hand placement    Ambulation/Gait Ambulation/Gait assistance: Supervision Gait Distance (Feet): 200 Feet Assistive device: Rolling walker (2 wheels) Gait Pattern/deviations: Step-through pattern, Decreased stride length       General Gait Details: nice step through pattern with RW and no LOB, minimal antalgia noted  Stairs Stairs: Yes Stairs assistance: Supervision Stair Management: Two rails, Step to pattern, Forwards Number of Stairs: 4 General stair comments: cues for technique and handout given  Wheelchair Mobility     Tilt Bed    Modified Rankin (Stroke Patients Only)       Balance Overall balance assessment: Needs assistance   Sitting balance-Leahy Scale: Normal       Standing balance-Leahy Scale: Fair Standing balance comment: can stand without device, needs RW for dynamic mobility                             Pertinent Vitals/Pain Pain Assessment Pain Assessment: Faces Faces Pain Scale: Hurts a little bit Pain Location: R hip Pain Descriptors / Indicators: Sore Pain Intervention(s): Monitored during session, Repositioned, Ice applied    Home Living Family/patient expects to be discharged to:: Private residence Living Arrangements: Alone Available Help at Discharge: Family;Available PRN/intermittently Type of Home: Mobile home Home Access: Stairs to enter Entrance Stairs-Rails: Right;Left;Can reach both Entrance Stairs-Number of Steps: 4 plus threshold   Home Layout: One level Home Equipment:  Shower seat      Prior Function                       Extremity/Trunk Assessment   Upper Extremity Assessment Upper Extremity Assessment: Defer to OT evaluation    Lower Extremity Assessment Lower Extremity Assessment: LLE deficits/detail LLE Deficits / Details: AAROM WFL, strength hip flexion 2/5, knee extension 3+/5, ankle  DF 4/5       Communication   Communication Communication: No apparent difficulties    Cognition Arousal: Alert Behavior During Therapy: WFL for tasks assessed/performed   PT - Cognitive impairments: No apparent impairments                         Following commands: Intact       Cueing       General Comments General comments (skin integrity, edema, etc.): Discussed home and possibly intermittent help after weekend, which is ok given current mobility status.  She reports family hoped for rehab, though no insurance and pt really can go home.  Discussed fall prevention and issued HEP    Exercises Total Joint Exercises Ankle Circles/Pumps: AROM, Both, 10 reps, Seated Quad Sets: AROM, Both, 5 reps, Seated Short Arc Quad: AROM, Left, 5 reps, Seated Heel Slides: AROM, Left, 5 reps, Seated Hip ABduction/ADduction: AROM, Left, 5 reps, Seated   Assessment/Plan    PT Assessment Patient needs continued PT services  PT Problem List Decreased strength;Decreased mobility;Decreased balance;Decreased knowledge of use of DME;Decreased activity tolerance;Decreased range of motion       PT Treatment Interventions DME instruction;Therapeutic exercise;Gait training;Stair training;Functional mobility training;Therapeutic activities;Patient/family education    PT Goals (Current goals can be found in the Care Plan section)  Acute Rehab PT Goals Patient Stated Goal: return to independent PT Goal Formulation: With patient Time For Goal Achievement: 04/30/23 Potential to Achieve Goals: Good    Frequency Min 3X/week     Co-evaluation               AM-PAC PT "6 Clicks" Mobility  Outcome Measure Help needed turning from your back to your side while in a flat bed without using bedrails?: A Little Help needed moving from lying on your back to sitting on the side of a flat bed without using bedrails?: A Little Help needed moving to and from a bed to a chair (including a  wheelchair)?: None Help needed standing up from a chair using your arms (e.g., wheelchair or bedside chair)?: None Help needed to walk in hospital room?: None Help needed climbing 3-5 steps with a railing? : A Little 6 Click Score: 21    End of Session Equipment Utilized During Treatment: Gait belt Activity Tolerance: Patient tolerated treatment well Patient left: in chair;with call bell/phone within reach   PT Visit Diagnosis: Difficulty in walking, not elsewhere classified (R26.2);Muscle weakness (generalized) (M62.81)    Time: 1050-1120 PT Time Calculation (min) (ACUTE ONLY): 30 min   Charges:   PT Evaluation $PT Eval Moderate Complexity: 1 Mod PT Treatments $Gait Training: 8-22 mins PT General Charges $$ ACUTE PT VISIT: 1 Visit         Sheran Lawless, PT Acute Rehabilitation Services Office:[phone] 04/23/2023   Elray Mcgregor 04/23/2023, 2:40 PM

## 2023-04-23 NOTE — Evaluation (Signed)
 Occupational Therapy Evaluation Patient Details Name: [name] MRN: [id number] DOB: [birth year] Today's Date: 04/23/2023   History of Present Illness   63 y.o. female adm 3/11.  PMH includes: Tobacco abuse and allergies to Seroquel and sulfa antibiotics.  Mechanical fall at her son's home.  S/p THA 3/13. WBAT.     Clinical Impressions Patient admitted for the diagnosis and procedure above.  Patient is doing remarkably well, expresses little pain, and is moving with distant supervision.  Supervision and cues for lower body ADL.  OT will follow in the acute setting to ensure safe discharge home.  No post acute OT is anticipated.       If plan is discharge home, recommend the following:   Assist for transportation     Functional Status Assessment   Patient has had a recent decline in their functional status and demonstrates the ability to make significant improvements in function in a reasonable and predictable amount of time.     Equipment Recommendations   None recommended by OT     Recommendations for Other Services         Precautions/Restrictions   Precautions Precautions: Fall Recall of Precautions/Restrictions: Intact Restrictions Weight Bearing Restrictions Per Provider Order: No     Mobility Bed Mobility Overal bed mobility: Modified Independent                  Transfers Overall transfer level: Needs assistance Equipment used: Rolling walker (2 wheels) Transfers: Sit to/from Stand, Bed to chair/wheelchair/BSC Sit to Stand: Supervision     Step pivot transfers: Supervision     General transfer comment: Light supervision, more just cueing for RW management      Balance Overall balance assessment: Needs assistance Sitting-balance support: Feet supported Sitting balance-Leahy Scale: Normal     Standing balance support: Reliant on assistive device for balance Standing balance-Leahy Scale: Good                              ADL either performed or assessed with clinical judgement   ADL                       Lower Body Dressing: Supervision/safety   Toilet Transfer: Supervision/safety;Rolling walker (2 wheels)                   Vision Patient Visual Report: No change from baseline       Perception Perception: Not tested       Praxis Praxis: Not tested       Pertinent Vitals/Pain Pain Assessment Pain Assessment: Faces Faces Pain Scale: Hurts a little bit Pain Location: R hip Pain Descriptors / Indicators: Aching Pain Intervention(s): Monitored during session     Extremity/Trunk Assessment Upper Extremity Assessment Upper Extremity Assessment: Overall WFL for tasks assessed   Lower Extremity Assessment Lower Extremity Assessment: Defer to PT evaluation       Communication Communication Communication: No apparent difficulties   Cognition Arousal: Alert Behavior During Therapy: WFL for tasks assessed/performed Cognition: No apparent impairments                               Following commands: Intact       Cueing  General Comments   Cueing Techniques: Verbal cues   VSS on RA   Exercises     Shoulder Instructions  Home Living Family/patient expects to be discharged to:: Private residence Living Arrangements: Alone Available Help at Discharge: Family;Available PRN/intermittently Type of Home: Mobile home Home Access: Stairs to enter Entrance Stairs-Number of Steps: 4 plus threshold Entrance Stairs-Rails: Right;Left;Can reach both Home Layout: One level     Bathroom Shower/Tub: Tub/shower unit;Walk-in shower   Bathroom Toilet: Standard Bathroom Accessibility: Yes How Accessible: Accessible via walker Home Equipment: Shower seat          Prior Functioning/Environment Prior Level of Function : Independent/Modified Independent;Working/employed;Driving                    OT Problem List: Pain   OT  Treatment/Interventions: Therapeutic activities;Self-care/ADL training      OT Goals(Current goals can be found in the care plan section)   Acute Rehab OT Goals Patient Stated Goal: Return home OT Goal Formulation: With patient Time For Goal Achievement: 05/07/23 Potential to Achieve Goals: Good ADL Goals Pt Will Perform Lower Body Dressing: with modified independence;sit to/from stand Pt Will Transfer to Toilet: with modified independence;regular height toilet;ambulating   OT Frequency:  Min 1X/week    Co-evaluation              AM-PAC OT "6 Clicks" Daily Activity     Outcome Measure Help from another person eating meals?: None Help from another person taking care of personal grooming?: None Help from another person toileting, which includes using toliet, bedpan, or urinal?: A Little Help from another person bathing (including washing, rinsing, drying)?: A Little Help from another person to put on and taking off regular upper body clothing?: None Help from another person to put on and taking off regular lower body clothing?: A Little 6 Click Score: 21   End of Session Equipment Utilized During Treatment: Rolling walker (2 wheels) Nurse Communication: Mobility status  Activity Tolerance: Patient tolerated treatment well Patient left: in chair;with call bell/phone within reach;with chair alarm set  OT Visit Diagnosis: Pain Pain - Right/Left: Right Pain - part of body: Hip                Time: 4098-1191 OT Time Calculation (min): 24 min Charges:  OT General Charges $OT Visit: 1 Visit OT Evaluation $OT Eval Moderate Complexity: 1 Mod OT Treatments $Self Care/Home Management : 8-22 mins  04/23/2023  RP, OTR/L  Acute Rehabilitation Services  Office:  [phone]   [name] 04/23/2023, 8:59 AM

## 2023-04-23 NOTE — Plan of Care (Signed)

## 2023-04-23 NOTE — Progress Notes (Signed)
 [facility] PROGRESS NOTE    Progress Note  [name]  ZOX:[id number] DOB: 1960/12/11 DOA: 04/20/2023 PCP: Patient, No Pcp Per     Brief Narrative:   [name] is an 63 y.o. female past medical history of tobacco abuse was seen in recently after an initial mechanical fall in the muddy driveway.  Has been taking NSAIDs at home, which she relates her pain has not improved.  Relates that today she was not able to bear weight, and show an impacted mid cervical left femur fracture BTK was consulted recommended surgical intervention on 04/22/2023.  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 Using minimal narcotics, she had a bowel movement. Lovenox for DVT prophylaxis per Ortho PT OT eval is pending, anticipate she may be able to go home with home health  Hypokalemia: Repleted orally now improved.  Leukocytosis: Likely reactive due to fracture.  Thrombocytosis Likely reactive.   DVT prophylaxis: lovenox Family Communication:none Status is: Inpatient Remains inpatient appropriate because: Acute left hip fracture    Code Status:     Code Status Orders  (From admission, onward)           Start     Ordered   04/20/23 1702  Full code  Continuous       Question:  By:  Answer:  Other   04/20/23 1707           Code Status History     This patient has a current code status but no historical code status.         IV Access:   Peripheral IV   Procedures and diagnostic studies:   DG HIP UNILAT WITH PELVIS 1V LEFT Result Date: 04/22/2023 CLINICAL DATA:  Elective surgery. EXAM: DG HIP (WITH OR WITHOUT PELVIS) 1V*L* COMPARISON:  04/20/2023 FINDINGS: Six fluoroscopic spot views of the pelvis and left hip obtained in the operating room. Sequential images during hip arthroplasty. Fluoroscopy time 24.2 seconds. Dose 1.8 mGy. IMPRESSION: Intraoperative fluoroscopy during  left hip arthroplasty. Electronically Signed   By: Narda Rutherford M.D.   On: 04/22/2023 14:57     Medical Consultants:   None.   Subjective:    [name] had a bowel movement relates she has no pain.  Objective:    Vitals:   04/22/23 1946 04/22/23 2000 04/23/23 0432 04/23/23 0924  BP: 120/86  121/64 128/64  Pulse: 85  78 72  Resp:    18  Temp: 98 F (36.7 C)  98 F (36.7 C) 98 F (36.7 C)  TempSrc: Oral Oral    SpO2: 97%  94% 98%  Weight:      Height:       SpO2: 98 % O2 Flow Rate (L/min): 2 L/min   Intake/Output Summary (Last 24 hours) at 04/23/2023 1031 Last data filed at 04/23/2023 0900 Gross per 24 hour  Intake 390 ml  Output 850 ml  Net -460 ml   Filed Weights   04/20/23 1327 04/22/23 1130  Weight: 49.4 kg 49.9 kg    Exam: General exam: In no acute distress. Respiratory system: Good air movement and clear to auscultation. Cardiovascular system: S1 & S2 heard, RRR. No JVD. Gastrointestinal system: Abdomen is nondistended, soft and nontender.  Extremities: No pedal edema. Skin: No rashes, lesions or ulcers Psychiatry: Judgement and insight appear normal. Mood & affect appropriate. Data Reviewed:    Labs: Basic Metabolic Panel: Recent Labs  Lab 04/20/23 1509  04/22/23 0554 04/22/23 1830 04/23/23 0440  NA 134* 138  --  132*  K 2.9* 4.6  --  4.4  CL 101 106  --  104  CO2 21* 23  --  21*  GLUCOSE 101* 94  --  165*  BUN 8 17  --  17  CREATININE 0.57 0.52 0.49 0.62  CALCIUM 8.7* 8.1*  --  8.3*   GFR Estimated Creatinine Clearance: 58.9 mL/min (by C-G formula based on SCr of 0.62 mg/dL). Liver Function Tests: No results for input(s): "AST", "ALT", "ALKPHOS", "BILITOT", "PROT", "ALBUMIN" in the last 168 hours. No results for input(s): "LIPASE", "AMYLASE" in the last 168 hours. No results for input(s): "AMMONIA" in the last 168 hours. Coagulation profile Recent Labs  Lab 04/20/23 1509  INR 1.1   COVID-19 Labs  No results for  input(s): "DDIMER", "FERRITIN", "LDH", "CRP" in the last 72 hours.  No results found for: "SARSCOV2NAA"  CBC: Recent Labs  Lab 04/20/23 1509 04/22/23 1830 04/23/23 0440  WBC 14.2* 20.8* 18.6*  NEUTROABS 11.0*  --   --   HGB 12.6 10.2* 9.2*  HCT 37.0 31.0* 27.8*  MCV 87.1 89.3 88.3  PLT 577* 422* 392   Cardiac Enzymes: No results for input(s): "CKTOTAL", "CKMB", "CKMBINDEX", "TROPONINI" in the last 168 hours. BNP (last 3 results) No results for input(s): "PROBNP" in the last 8760 hours. CBG: Recent Labs  Lab 04/21/23 0806 04/22/23 0621  GLUCAP 100* 95   D-Dimer: No results for input(s): "DDIMER" in the last 72 hours. Hgb A1c: No results for input(s): "HGBA1C" in the last 72 hours. Lipid Profile: No results for input(s): "CHOL", "HDL", "LDLCALC", "TRIG", "CHOLHDL", "LDLDIRECT" in the last 72 hours. Thyroid function studies: No results for input(s): "TSH", "T4TOTAL", "T3FREE", "THYROIDAB" in the last 72 hours.  Invalid input(s): "FREET3" Anemia work up: No results for input(s): "VITAMINB12", "FOLATE", "FERRITIN", "TIBC", "IRON", "RETICCTPCT" in the last 72 hours. Sepsis Labs: Recent Labs  Lab 04/20/23 1509 04/22/23 1830 04/23/23 0440  WBC 14.2* 20.8* 18.6*   Microbiology Recent Results (from the past 240 hours)  Surgical PCR screen     Status: None   Collection Time: 04/20/23 12:15 AM   Specimen: Nasal Mucosa; Nasal Swab  Result Value Ref Range Status   MRSA, PCR NEGATIVE NEGATIVE Final   Staphylococcus aureus NEGATIVE NEGATIVE Final    Comment: (NOTE) The Xpert SA Assay (FDA approved for NASAL specimens in patients 68 years of age and older), is one component of a comprehensive surveillance program. It is not intended to diagnose infection nor to guide or monitor treatment. Performed at [facility], [address], [place], [place] [address]      Medications:    acetaminophen  1,000 mg Oral Q6H   docusate sodium  100 mg Oral BID   enoxaparin  (LOVENOX) injection  40 mg Subcutaneous Q24H   feeding supplement  237 mL Oral BID BM   mupirocin ointment  1 Application Nasal BID   pantoprazole  40 mg Oral BID   Continuous Infusions:      LOS: 3 days   Marinda Elk  [facility]  04/23/2023, 10:31 AM

## 2023-04-24 ENCOUNTER — Other Ambulatory Visit (HOSPITAL_COMMUNITY): Payer: Self-pay

## 2023-04-24 DIAGNOSIS — E43 Unspecified severe protein-calorie malnutrition: Secondary | ICD-10-CM

## 2023-04-24 DIAGNOSIS — S72002A Fracture of unspecified part of neck of left femur, initial encounter for closed fracture: Secondary | ICD-10-CM

## 2023-04-24 LAB — BASIC METABOLIC PANEL
Anion gap: 10 (ref 5–15)
BUN: 8 mg/dL (ref 6–20)
CO2: 21 mmol/L — ABNORMAL LOW (ref 22–32)
Calcium: 8.3 mg/dL — ABNORMAL LOW (ref 8.9–10.3)
Chloride: 103 mmol/L (ref 98–111)
Creatinine, Ser: 0.51 mg/dL (ref 0.44–1.00)
GFR, Estimated: >60 mL/min (ref >60)
Glucose, Bld: 139 mg/dL — ABNORMAL HIGH (ref 70–99)
Potassium: 3.7 mmol/L (ref 3.5–5.1)
Sodium: 134 mmol/L — ABNORMAL LOW (ref 135–145)

## 2023-04-24 LAB — CBC
HCT: 28.9 % — ABNORMAL LOW (ref 36.0–46.0)
Hemoglobin: 9.5 g/dL — ABNORMAL LOW (ref 12.0–15.0)
MCH: 28.7 pg (ref 26.0–34.0)
MCHC: 32.9 g/dL (ref 30.0–36.0)
MCV: 87.3 fL (ref 80.0–100.0)
Platelets: 411 10*3/uL — ABNORMAL HIGH (ref 150–400)
RBC: 3.31 MIL/uL — ABNORMAL LOW (ref 3.87–5.11)
RDW: 15.5 % (ref 11.5–15.5)
WBC: 14.5 10*3/uL — ABNORMAL HIGH (ref 4.0–10.5)
nRBC: 0 % (ref 0.0–0.2)

## 2023-04-24 MED ORDER — POLYETHYLENE GLYCOL 3350 17 GM/SCOOP PO POWD
17.0000 g | Freq: Every day | ORAL | 0 refills | Status: AC | PRN
  Filled 2023-04-24: qty 238, 14d supply, fill #0

## 2023-04-24 MED ORDER — ENOXAPARIN SODIUM 40 MG/0.4ML IJ SOSY
40.0000 mg | PREFILLED_SYRINGE | Freq: Every day | INTRAMUSCULAR | 0 refills | Status: AC
  Filled 2023-04-24: qty 5.6, 14d supply, fill #0

## 2023-04-24 MED ORDER — OXYCODONE-ACETAMINOPHEN 5-325 MG PO TABS
1.0000 | ORAL_TABLET | Freq: Four times a day (QID) | ORAL | 0 refills | Status: AC | PRN
  Filled 2023-04-24: qty 30, 4d supply, fill #0

## 2023-04-24 NOTE — Discharge Summary (Signed)
 Physician Discharge Summary  [name] ZHY:[id number] DOB: 08/06/1960 DOA: 04/20/2023  PCP: Patient, No Pcp Per  Admit date: 04/20/2023 Discharge date: 04/24/2023  Admitted From: Home Disposition:  Home  Recommendations for Outpatient Follow-up:  Follow up with PCP in 1-2 weeks Please obtain BMP/CBC in one week   Home Health:No Equipment/Devices:none  Discharge Condition:Stable CODE STATUS:Full Diet recommendation: Heart Healthy   Brief/Interim Summary: 63 y.o. female past medical history of tobacco abuse was seen in recently after an initial mechanical fall in the muddy driveway.  Has been taking NSAIDs at home, which she relates her pain has not improved.  Relates that today she was not able to bear weight, and show an impacted mid cervical left femur fracture BTK was consulted recommended surgical intervention on 04/22/2023.   Discharge Diagnoses:  Principal Problem:   Displaced fracture of base of neck of left femur, initial encounter for closed fracture [facility]) Active Problems:   Protein-calorie malnutrition, severe  Left hip fracture:  Imaging showed cervical left femur fracture orthopedic surgery was consulted she status post surgical intervention on 04/22/2023. Surgery recommended Lovenox for DVT prophylaxis and narcotics for pain control. PT evaluated the patient recommended home health PT. She will continue MiraLAX as needed.  Hypokalemia: Potassium was repleted now improved.  Leukocytosis: Likely reactive.  Thrombocytosis: Likely reactive  Discharge Instructions  Discharge Instructions     Amb Referral to Osteoporosis Management    Complete by: As directed    Diet - low sodium heart healthy   Complete by: As directed    Increase activity slowly   Complete by: As directed    Weight bearing as tolerated   Complete by: As directed       Allergies as of 04/24/2023       Reactions   Seroquel [quetiapine Fumarate] Anaphylaxis   Pt reports that it made  her throat close   Sulfa Antibiotics Anaphylaxis        Medication List     STOP taking these medications    acetaminophen 500 MG tablet Commonly known as: TYLENOL   GOODY HEADACHE PO       TAKE these medications    Biotin 1 MG Caps Take 1 capsule by mouth daily.   enoxaparin 40 MG/0.4ML injection Commonly known as: LOVENOX Inject 0.4 mLs (40 mg total) into the skin daily for 14 days.   MULTIVITAMIN ADULT PO Take 1 tablet by mouth daily.   oxyCODONE-acetaminophen 5-325 MG tablet Commonly known as: Percocet Take 1-2 tablets by mouth 2 (two) times daily as needed for severe pain (pain score 7-10).   polyethylene glycol 17 g packet Commonly known as: MIRALAX / GLYCOLAX Take 17 g by mouth daily as needed for mild constipation.   ST JOHNS WORT PO Take 1 tablet by mouth daily.   VITAMIN B COMPLEX-C PO Take 1 tablet by mouth daily.               Discharge Care Instructions  (From admission, onward)           Start     Ordered   04/22/23 0000  Weight bearing as tolerated        04/22/23 1428            Follow-up Information     Cristie Hem, PA-C Follow up in 2 week(s).   Specialty: Orthopedic Surgery Why: For suture removal, For wound re-check Contact information: [address] [place] [address] [phone]  Allergies  Allergen Reactions   Seroquel [Quetiapine Fumarate] Anaphylaxis    Pt reports that it made her throat close   Sulfa Antibiotics Anaphylaxis    Consultations: Orthopedic surgery   Procedures/Studies: DG Pelvis Portable Result Date: 04/23/2023 CLINICAL DATA:  Status post total left hip arthroplasty. EXAM: PORTABLE PELVIS 1-2 VIEWS COMPARISON:  Intraoperative fluoroscopy total left hip arthroplasty 04/22/2023, pelvis and left hip radiographs 04/20/2023 FINDINGS: Interval total left hip arthroplasty. No perihardware lucency is seen to indicate hardware failure or loosening. Expected  lateral left hip and thigh postoperative subcutaneous air. The right femoroacetabular joint space is maintained. Mild bilateral sacroiliac subchondral sclerosis. No acute fracture or dislocation. IMPRESSION: Interval total left hip arthroplasty without evidence of hardware failure. Electronically Signed   By: Neita Garnet M.D.   On: 04/23/2023 14:22   DG HIP UNILAT WITH PELVIS 1V LEFT Result Date: 04/22/2023 CLINICAL DATA:  Elective surgery. EXAM: DG HIP (WITH OR WITHOUT PELVIS) 1V*L* COMPARISON:  04/20/2023 FINDINGS: Six fluoroscopic spot views of the pelvis and left hip obtained in the operating room. Sequential images during hip arthroplasty. Fluoroscopy time 24.2 seconds. Dose 1.8 mGy. IMPRESSION: Intraoperative fluoroscopy during left hip arthroplasty. Electronically Signed   By: Narda Rutherford M.D.   On: 04/22/2023 14:57   DG Chest 1 View Result Date: 04/20/2023 CLINICAL DATA:  Hip fracture, preop EXAM: CHEST  1 VIEW COMPARISON:  None Available. FINDINGS: Lungs are clear. Heart size and mediastinal contours are within normal limits. Heavy mitral annulus calcifications. Aortic Atherosclerosis (ICD10-170.0). No effusion. Visualized bones unremarkable. IMPRESSION: 1. No acute cardiopulmonary disease. Electronically Signed   By: Corlis Leak M.D.   On: 04/20/2023 16:44   DG Hip Unilat W or Wo Pelvis 2-3 Views Left Result Date: 04/20/2023 CLINICAL DATA:  pain EXAM: DG HIP (WITH OR WITHOUT PELVIS) 2-3V LEFT COMPARISON:  CT 10/11/2011 FINDINGS: Impacted mid cervical left femur fracture, new since previous. No dislocation. Bony pelvis intact. Mild spondylitic changes in the visualized lower lumbar spine. IMPRESSION: Impacted mid cervical left femur fracture. Electronically Signed   By: Corlis Leak M.D.   On: 04/20/2023 16:44    Subjective: No compalins  Discharge Exam: Vitals:   04/23/23 2017 04/24/23 0525  BP: 117/74 131/77  Pulse: 79 76  Resp: 18 18  Temp: 99.1 F (37.3 C) 98.2 F (36.8 C)   SpO2: 97% 97%   Vitals:   04/23/23 0924 04/23/23 1442 04/23/23 2017 04/24/23 0525  BP: 128/64 120/69 117/74 131/77  Pulse: 72 81 79 76  Resp: 18 18 18 18   Temp: 98 F (36.7 C) 98.2 F (36.8 C) 99.1 F (37.3 C) 98.2 F (36.8 C)  TempSrc:   Oral Oral  SpO2: 98% 94% 97% 97%  Weight:      Height:        General: Pt is alert, awake, not in acute distress Cardiovascular: RRR, S1/S2 +, no rubs, no gallops Respiratory: CTA bilaterally, no wheezing, no rhonchi Abdominal: Soft, NT, ND, bowel sounds + Extremities: no edema, no cyanosis    The results of significant diagnostics from this hospitalization (including imaging, microbiology, ancillary and laboratory) are listed below for reference.     Microbiology: Recent Results (from the past 240 hours)  Surgical PCR screen     Status: None   Collection Time: 04/20/23 12:15 AM   Specimen: Nasal Mucosa; Nasal Swab  Result Value Ref Range Status   MRSA, PCR NEGATIVE NEGATIVE Final   Staphylococcus aureus NEGATIVE NEGATIVE Final    Comment: (NOTE)  The Xpert SA Assay (FDA approved for NASAL specimens in patients 77 years of age and older), is one component of a comprehensive surveillance program. It is not intended to diagnose infection nor to guide or monitor treatment. Performed at [facility], [address], [place], [place] [address]      Labs: BNP (last 3 results) No results for input(s): "BNP" in the last 8760 hours. Basic Metabolic Panel: Recent Labs  Lab 04/20/23 1509 04/22/23 0554 04/22/23 1830 04/23/23 0440 04/24/23 0800  NA 134* 138  --  132* 134*  K 2.9* 4.6  --  4.4 3.7  CL 101 106  --  104 103  CO2 21* 23  --  21* 21*  GLUCOSE 101* 94  --  165* 139*  BUN 8 17  --  17 8  CREATININE 0.57 0.52 0.49 0.62 0.51  CALCIUM 8.7* 8.1*  --  8.3* 8.3*   Liver Function Tests: No results for input(s): "AST", "ALT", "ALKPHOS", "BILITOT", "PROT", "ALBUMIN" in the last 168 hours. No results for input(s):  "LIPASE", "AMYLASE" in the last 168 hours. No results for input(s): "AMMONIA" in the last 168 hours. CBC: Recent Labs  Lab 04/20/23 1509 04/22/23 1830 04/23/23 0440 04/24/23 0800  WBC 14.2* 20.8* 18.6* 14.5*  NEUTROABS 11.0*  --   --   --   HGB 12.6 10.2* 9.2* 9.5*  HCT 37.0 31.0* 27.8* 28.9*  MCV 87.1 89.3 88.3 87.3  PLT 577* 422* 392 411*   Cardiac Enzymes: No results for input(s): "CKTOTAL", "CKMB", "CKMBINDEX", "TROPONINI" in the last 168 hours. BNP: Invalid input(s): "POCBNP" CBG: Recent Labs  Lab 04/21/23 0806 04/22/23 0621  GLUCAP 100* 95   D-Dimer No results for input(s): "DDIMER" in the last 72 hours. Hgb A1c No results for input(s): "HGBA1C" in the last 72 hours. Lipid Profile No results for input(s): "CHOL", "HDL", "LDLCALC", "TRIG", "CHOLHDL", "LDLDIRECT" in the last 72 hours. Thyroid function studies No results for input(s): "TSH", "T4TOTAL", "T3FREE", "THYROIDAB" in the last 72 hours.  Invalid input(s): "FREET3" Anemia work up No results for input(s): "VITAMINB12", "FOLATE", "FERRITIN", "TIBC", "IRON", "RETICCTPCT" in the last 72 hours. Urinalysis    Component Value Date/Time   COLORURINE YELLOW 10/11/2011 1816   APPEARANCEUR CLOUDY (A) 10/11/2011 1816   LABSPEC 1.025 10/11/2011 1816   PHURINE 6.0 10/11/2011 1816   GLUCOSEU NEGATIVE 10/11/2011 1816   HGBUR TRACE (A) 10/11/2011 1816   BILIRUBINUR NEGATIVE 10/11/2011 1816   KETONESUR NEGATIVE 10/11/2011 1816   PROTEINUR NEGATIVE 10/11/2011 1816   UROBILINOGEN 0.2 10/11/2011 1816   NITRITE NEGATIVE 10/11/2011 1816   LEUKOCYTESUR SMALL (A) 10/11/2011 1816   Sepsis Labs Recent Labs  Lab 04/20/23 1509 04/22/23 1830 04/23/23 0440 04/24/23 0800  WBC 14.2* 20.8* 18.6* 14.5*   Microbiology Recent Results (from the past 240 hours)  Surgical PCR screen     Status: None   Collection Time: 04/20/23 12:15 AM   Specimen: Nasal Mucosa; Nasal Swab  Result Value Ref Range Status   MRSA, PCR NEGATIVE  NEGATIVE Final   Staphylococcus aureus NEGATIVE NEGATIVE Final    Comment: (NOTE) The Xpert SA Assay (FDA approved for NASAL specimens in patients 62 years of age and older), is one component of a comprehensive surveillance program. It is not intended to diagnose infection nor to guide or monitor treatment. Performed at [facility], [address] [place], [place] [address]      Time coordinating discharge: Over 35 minutes  SIGNED:   Darin Engels  David Stall, MD  [facility] 04/24/2023, 9:19 AM Pager   If 7PM-7AM, please contact night-coverage [email or web address] Password [email or web address]

## 2023-04-24 NOTE — Progress Notes (Signed)
 Physical Therapy Treatment Patient Details Name: [name] MRN: [id number] DOB: [birth year] Today's Date: 04/24/2023   History of Present Illness 63 y.o. female adm 3/11.  PMH includes: Tobacco abuse and allergies to Seroquel and sulfa antibiotics.  Mechanical fall at her son's home.  S/p THA 3/13. WBAT.    PT Comments  Patient making good progress with mobility, no cues needed for bed mobility sequencing, supervision for safety with supine>sit and for sit<>stand with RW. Pt amb ~200' with stair mobility during gait. Pt demonstrated safe step pattern and management of RW with gait. Supervision only for stair mobility with step to pattern "up with good, down with bad" with 2 hand rails and pt safely negotiated curb with RW. EOS reviewed HEP for seated/supine exercises with pt and addressed questions regarding discharge. Will continue to progress pt as able during acute stay.    If plan is discharge home, recommend the following: Assistance with cooking/housework;Assist for transportation   Can travel by private vehicle        Equipment Recommendations   (youth RW)    Recommendations for Other Services       Precautions / Restrictions Precautions Precautions: Fall Recall of Precautions/Restrictions: Intact Restrictions Weight Bearing Restrictions Per Provider Order: No     Mobility  Bed Mobility Overal bed mobility: Needs Assistance Bed Mobility: Supine to Sit     Supine to sit: Supervision     General bed mobility comments: sup for safety, no cues    Transfers Overall transfer level: Needs assistance Equipment used: Rolling walker (2 wheels) Transfers: Sit to/from Stand Sit to Stand: Supervision           General transfer comment: no physical assistance, cues for hand placement    Ambulation/Gait Ambulation/Gait assistance: Supervision Gait Distance (Feet): 200 Feet Assistive device: Rolling walker (2 wheels) Gait Pattern/deviations: Step-through pattern,  Decreased stride length Gait velocity: fair     General Gait Details: nice step through pattern with RW and no LOB, minimal antalgia noted   Stairs   Stairs assistance: Supervision Stair Management: Two rails, Step to pattern, Forwards Number of Stairs: 3 General stair comments: cues for "up with good, down with bad" and pt using bil HR for support. sup for safety. cues for curb/threshold negotiation with RW, CGA for safety.   Wheelchair Mobility     Tilt Bed    Modified Rankin (Stroke Patients Only)       Balance Overall balance assessment: Needs assistance Sitting-balance support: Feet supported Sitting balance-Leahy Scale: Normal     Standing balance support: Reliant on assistive device for balance Standing balance-Leahy Scale: Fair Standing balance comment: can stand without device, needs RW for dynamic mobility                            Communication Communication Communication: No apparent difficulties  Cognition Arousal: Alert Behavior During Therapy: WFL for tasks assessed/performed   PT - Cognitive impairments: No apparent impairments                         Following commands: Intact      Cueing Cueing Techniques: Verbal cues  Exercises Total Joint Exercises Ankle Circles/Pumps: AROM, Both, 10 reps, Seated Quad Sets: AROM, Both, 5 reps, Seated Short Arc Quad: AROM, Left, 5 reps, Seated Heel Slides: AROM, Left, 5 reps, Seated Hip ABduction/ADduction: AROM, Left, 5 reps, Seated Long Arc Quad: AROM, Left, 5 reps, Seated  General Comments        Pertinent Vitals/Pain Pain Assessment Pain Assessment: No/denies pain Faces Pain Scale: Hurts a little bit Pain Location: R hip Pain Descriptors / Indicators: Sore Pain Intervention(s): Limited activity within patient's tolerance, Monitored during session, Repositioned    Home Living                          Prior Function            PT Goals (current goals  can now be found in the care plan section) Acute Rehab PT Goals Patient Stated Goal: return to independent PT Goal Formulation: With patient Time For Goal Achievement: 04/30/23 Potential to Achieve Goals: Good Progress towards PT goals: Progressing toward goals    Frequency    Min 3X/week      PT Plan      Co-evaluation              AM-PAC PT "6 Clicks" Mobility   Outcome Measure  Help needed turning from your back to your side while in a flat bed without using bedrails?: A Little Help needed moving from lying on your back to sitting on the side of a flat bed without using bedrails?: A Little Help needed moving to and from a bed to a chair (including a wheelchair)?: None Help needed standing up from a chair using your arms (e.g., wheelchair or bedside chair)?: None Help needed to walk in hospital room?: None Help needed climbing 3-5 steps with a railing? : A Little 6 Click Score: 21    End of Session Equipment Utilized During Treatment: Gait belt Activity Tolerance: Patient tolerated treatment well Patient left: in bed;with family/visitor present;with call bell/phone within reach (EOB) Nurse Communication: Mobility status PT Visit Diagnosis: Difficulty in walking, not elsewhere classified (R26.2);Muscle weakness (generalized) (M62.81)     Time: 9528-4132 PT Time Calculation (min) (ACUTE ONLY): 23 min  Charges:    $Gait Training: 8-22 mins $Therapeutic Exercise: 8-22 mins PT General Charges $$ ACUTE PT VISIT: 1 Visit                     Wynn Maudlin, DPT Acute Rehabilitation Services Office [phone]  04/24/23 12:32 PM

## 2023-04-24 NOTE — TOC Transition Note (Signed)
 Transition of Care [facility]) - Discharge Note   Patient Details  Name: [name] MRN: [id number] Date of Birth: 22-Jan-1961  Transition of Care [facility]) CM/SW Contact:  Ronny Bacon, RN Phone Number: 04/24/2023, 1:01 PM   Clinical Narrative:   Patient is being discharged today. Spoke with patient by phone, she is unable to pay for Rolling walker.  LOG approved by [facility] supervisor on call and RW ordered through [facility] with [employer] to be delivered to the discharge lounge where patient will be.  LOG form emailed to [place] with [employer].    Final next level of care: Home/Self Care Barriers to Discharge: No Barriers Identified   Patient Goals and CMS Choice            Discharge Placement                       Discharge Plan and Services Additional resources added to the After Visit Summary for     Discharge Planning Services: CM Consult            DME Arranged: Dan Humphreys rolling (LOG approved by Sharol Roussel) DME Agency: [employer] Date DME Agency Contacted: 04/24/23 Time DME Agency Contacted: [phone] Representative spoke with at DME Agency: Vaughan Basta            Social Drivers of Health (SDOH) Interventions SDOH Screenings   Food Insecurity: No Food Insecurity (04/20/2023)  Housing: Low Risk  (04/20/2023)  Transportation Needs: No Transportation Needs (04/20/2023)  Utilities: Not At Risk (04/20/2023)  Tobacco Use: High Risk (04/22/2023)     Readmission Risk Interventions     No data to display

## 2023-05-03 NOTE — Progress Notes (Unsigned)
   Post-Op Visit Note   Patient: [name]           Date of Birth: 03-25-1960           MRN: [id number] Visit Date: 05/04/2023 PCP: Patient, No Pcp Per   Assessment & Plan:  Chief Complaint: No chief complaint on file. Visit Diagnoses: No diagnosis found.  Plan: ***  Follow-Up Instructions: No follow-ups on file.   Orders:  No orders of the defined types were placed in this encounter. No orders of the defined types were placed in this encounter.  Imaging: No results found.  PMFS History: Patient Active Problem List   Diagnosis Date Noted  . Protein-calorie malnutrition, severe 04/21/2023  . Displaced fracture of base of neck of left femur, initial encounter for closed fracture (HCC) 04/20/2023  . Foot sprain, left, initial encounter 08/08/2020  . Foot pain, left 08/08/2020  . 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 Bipolar affective disorder, depressed (HCC) 10/12/2011    Class: Acute  . Major depressive disorder, recurrent episode (HCC) 10/10/2011  . Generalized anxiety disorder 10/10/2011  . Alcohol dependence (HCC) 10/10/2011   Past Medical History:  Diagnosis Date  . Arthritis   . Depression   . Seizures (HCC)    2 seizures, last in April    No family history on file.  Past Surgical History:  Procedure Laterality Date  . c section    . TOTAL HIP ARTHROPLASTY Left 04/22/2023   Procedure: ARTHROPLASTY, HIP, TOTAL, ANTERIOR APPROACH;  Surgeon: Tarry Kos, MD;  Location: [facility] OR;  Service: Orthopedics;  Laterality: Left;   Social History   Occupational History  . Not on file  Tobacco Use  . Smoking status: Every Day    Current packs/day: 1.50    Average packs/day: 1.5 packs/day for 30.0 years (45.0 ttl pk-yrs)    Types: Cigarettes  . Smokeless tobacco: Never  Substance and Sexual Activity  . Alcohol use: Yes    Alcohol/week: 12.0 standard drinks of alcohol    Types: 12 Standard drinks or equivalent per week    Comment:  Occassional drinker  . Drug use: No  . Sexual activity: Yes    Birth control/protection: None

## 2023-05-04 ENCOUNTER — Ambulatory Visit (INDEPENDENT_AMBULATORY_CARE_PROVIDER_SITE_OTHER): Payer: Self-pay | Admitting: Orthopaedic Surgery

## 2023-05-04 ENCOUNTER — Other Ambulatory Visit (INDEPENDENT_AMBULATORY_CARE_PROVIDER_SITE_OTHER): Payer: Self-pay

## 2023-05-04 ENCOUNTER — Encounter: Payer: Self-pay | Admitting: Orthopaedic Surgery

## 2023-05-04 DIAGNOSIS — Z96642 Presence of left artificial hip joint: Secondary | ICD-10-CM | POA: Diagnosis not present

## 2023-05-10 ENCOUNTER — Encounter: Payer: Self-pay | Admitting: Physician Assistant

## 2023-05-10 ENCOUNTER — Telehealth: Payer: Self-pay | Admitting: Orthopaedic Surgery

## 2023-05-10 NOTE — Telephone Encounter (Signed)
 Yes thanks

## 2023-05-10 NOTE — Telephone Encounter (Signed)
 Pt states she is scheduled to return to work on 05/18/23 and needs a return to work note.

## 2023-05-10 NOTE — Telephone Encounter (Signed)
 Spoke with patient. Note placed up front and she will pick up tomorrow.

## 2023-05-11 NOTE — Telephone Encounter (Signed)
Patient picked up note 

## 2023-06-01 ENCOUNTER — Other Ambulatory Visit (INDEPENDENT_AMBULATORY_CARE_PROVIDER_SITE_OTHER)

## 2023-06-01 ENCOUNTER — Ambulatory Visit (INDEPENDENT_AMBULATORY_CARE_PROVIDER_SITE_OTHER): Payer: Self-pay | Admitting: Physician Assistant

## 2023-06-01 DIAGNOSIS — Z96642 Presence of left artificial hip joint: Secondary | ICD-10-CM

## 2023-06-01 NOTE — Progress Notes (Signed)
   Post-Op Visit Note   Patient: [name]           Date of Birth: 1961-01-06           MRN: [id number] Visit Date: 06/01/2023 PCP: Patient, No Pcp Per   Assessment & Plan:  Chief Complaint:  Chief Complaint  Patient presents with   Left Hip - Routine Post Op   Visit Diagnoses:  1. Status post left hip replacement     Plan: Patient is a pleasant 63 year old female who comes in today 6 weeks status post left total hip replacement from a femoral neck fracture, date of surgery 04/22/2023.  She has been doing great.  She has returned to work without any issues.  She is currently scheduled to see Katheran Palms and her osteoporosis clinic.  Examination of her left hip reveals painless hip flexion.  She is neurovascularly intact distally.  At this point, she will continue to advance with activity as tolerated.  Dental prophylaxis reinforced.  Follow-up in 6 weeks for repeat evaluation and AP pelvis x-rays.  Call with concerns or questions.  Follow-Up Instructions: Return in about 6 weeks (around 07/13/2023).   Orders:  Orders Placed This Encounter  Procedures   XR HIP UNILAT W OR W/O PELVIS 1V LEFT   No orders of the defined types were placed in this encounter.   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  Past Surgical History:  Procedure Laterality Date   c section     TOTAL HIP ARTHROPLASTY Left 04/22/2023   Procedure: ARTHROPLASTY, HIP, TOTAL, ANTERIOR APPROACH;  Surgeon: Wes Hamman, MD;  Location: [facility] OR;  Service: Orthopedics;  Laterality: Left;   Social History   Occupational History   Not on file  Tobacco Use   Smoking status: Every Day    Current packs/day: 1.50    Average packs/day: 1.5 packs/day for 30.0 years (45.0 ttl pk-yrs)    Types: Cigarettes   Smokeless tobacco: Never  Substance and Sexual Activity   Alcohol  use: Yes    Alcohol /week: 12.0 standard drinks of alcohol     Types: 12 Standard drinks or equivalent per week    Comment: Occassional drinker   Drug use: No   Sexual activity: Yes    Birth control/protection: None

## 2023-06-14 ENCOUNTER — Ambulatory Visit: Payer: Self-pay | Admitting: Physician Assistant

## 2023-06-14 ENCOUNTER — Encounter: Payer: Self-pay | Admitting: Physician Assistant

## 2023-06-14 DIAGNOSIS — M8000XA Age-related osteoporosis with current pathological fracture, unspecified site, initial encounter for fracture: Secondary | ICD-10-CM

## 2023-06-14 DIAGNOSIS — M810 Age-related osteoporosis without current pathological fracture: Secondary | ICD-10-CM | POA: Insufficient documentation

## 2023-06-14 NOTE — Progress Notes (Signed)
 Office Visit Note   Patient: [name]           Date of Birth: March 25, 1960           MRN: [id number] Visit Date: 06/14/2023              Requested by: Wes Hamman, MD [address] [place]  [address] [place],  [place] [address] PCP: Patient, No Pcp Per   Assessment & Plan: Visit Diagnoses:  1. Osteoporosis with current pathological fracture, unspecified osteoporosis type, initial encounter     Plan: [name] is a pleasant 63 year old woman who was referred from [place] for evaluation of osteoporosis.  In 2020 she sustained a distal radius fracture.  Most recently she is status post hip replacement done secondary to a hip fracture.  She currently has not been worked up for osteoporosis she does not take any medication.  She has no history of cancer no history of heart disease.  No history of kidney disease ulcers or gastric bypass.  She did have onset early menopause at 89 but did not do any hormone replacement therapy.  She does not really take calcium or vitamin D.  She smokes 1 pack of cigarettes a day.  Drinks about 6 drinks a week she works as a Production assistant, radio and does a lot of walking on her job but no particular exercises.  She has not had any major dental work in the last year.  No history of osteoporosis in her family.  Her current BMI is 18.  Given these factors she is at high risk for osteoporosis.  I do believe she has osteoporosis given her history and her 2 fractures in the last 5 years.  We will do calcium vitamin D TSH PTH to and vitamin D to ensure no other causes for her osteoporosis.  Will also engage her with a bone DEXA scan.  I do think that she more than likely will need an anabolic medication.  We also talked about incorporating some vitamin D calcium in her diet given her information.  Also she used to exercise and is agreeable to getting back into some weight training.  Spent 30 to 45 minutes evaluating her chart speaking to her in person and answering all of her questions  Follow-Up  Instructions: After labs and bone density scan  Orders:  No orders of the defined types were placed in this encounter.  No orders of the defined types were placed in this encounter.     Procedures: No procedures performed   Clinical Data: No additional findings.   Subjective: No chief complaint on file.   HPI pleasant 63 year old woman a patient of Dr. Christiane Cowing.  Comes in today for evaluation of osteoporosis.  She has had both a distal radius and a hip fracture in the last 5 years.  She has never had a bone density scan or complete lab work  Review of Systems  All other systems reviewed and are negative.    Objective: Vital Signs: There were no vitals taken for this visit.  Physical Exam Constitutional:      Appearance: Normal appearance.  Pulmonary:     Effort: Pulmonary effort is normal.  Skin:    General: Skin is warm and dry.  Neurological:     General: No focal deficit present.     Mental Status: She is alert and oriented to person, place, and time.  Psychiatric:        Mood and Affect: Mood normal.  Behavior: Behavior normal.       Specialty Comments:  No specialty comments available.  Imaging: No results found.   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 No pertinent family history.  Past Surgical History:  Procedure Laterality Date   c section     TOTAL HIP ARTHROPLASTY Left 04/22/2023   Procedure: ARTHROPLASTY, HIP, TOTAL, ANTERIOR APPROACH;  Surgeon: Wes Hamman, MD;  Location: [facility] OR;  Service: Orthopedics;  Laterality: Left;   Social History   Occupational History   Not on file  Tobacco Use   Smoking status: Every Day    Current packs/day: 1.50    Average packs/day: 1.5 packs/day for 30.0 years (45.0 ttl pk-yrs)    Types: Cigarettes   Smokeless tobacco: Never  Substance and Sexual Activity   Alcohol  use: Yes    Alcohol /week: 12.0 standard drinks of alcohol     Types: 12 Standard drinks or equivalent per week    Comment: Occassional drinker   Drug use: No   Sexual activity: Yes    Birth control/protection: None

## 2023-06-14 NOTE — Addendum Note (Signed)
 Addended by: Georgann Kim on: 06/14/2023 11:15 AM   Modules accepted: Orders

## 2023-06-17 LAB — COMPREHENSIVE METABOLIC PANEL WITH GFR
AG Ratio: 1.2 (calc) (ref 1.0–2.5)
ALT: 9 U/L (ref 6–29)
AST: 18 U/L (ref 10–35)
Albumin: 4.1 g/dL (ref 3.6–5.1)
Alkaline phosphatase (APISO): 89 U/L (ref 37–153)
BUN: 11 mg/dL (ref 7–25)
CO2: 16 mmol/L — ABNORMAL LOW (ref 20–32)
Calcium: 9 mg/dL (ref 8.6–10.4)
Chloride: 104 mmol/L (ref 98–110)
Creat: 0.55 mg/dL (ref 0.50–1.05)
Globulin: 3.3 g/dL (ref 1.9–3.7)
Potassium: 4.9 mmol/L (ref 3.5–5.3)
Sodium: 139 mmol/L (ref 135–146)
Total Bilirubin: 0.2 mg/dL (ref 0.2–1.2)
Total Protein: 7.4 g/dL (ref 6.1–8.1)
eGFR: 105 mL/min/{1.73_m2} (ref >=60)

## 2023-06-17 LAB — VITAMIN D 25 HYDROXY (VIT D DEFICIENCY, FRACTURES): Vit D, 25-Hydroxy: 25 ng/mL — ABNORMAL LOW (ref 30–100)

## 2023-06-17 LAB — EXTRA SPECIMEN

## 2023-06-17 LAB — SPECIMEN COMPROMISED

## 2023-06-17 LAB — PARATHYROID HORMONE, INTACT (NO CA): PTH: 10 pg/mL — ABNORMAL LOW (ref 16–77)

## 2023-06-17 LAB — TSH: TSH: 1.7 m[IU]/L (ref 0.40–4.50)

## 2023-11-09 ENCOUNTER — Telehealth: Payer: Self-pay

## 2023-11-09 NOTE — Telephone Encounter (Signed)
 This patient was to follow up with [name] after her Bone density   .  The patient was called on  5-13 LVM to schedule appt 5-6 LVM to schedule appt

## 2023-12-30 ENCOUNTER — Other Ambulatory Visit (HOSPITAL_COMMUNITY): Payer: Self-pay
